# Patient Record
Sex: Female | Born: 1965 | Race: White | Hispanic: No | Marital: Married | Smoking: Former smoker
Health system: Southern US, Community
[De-identification: ages and names within clinical notes are randomized; demographics above are authoritative.]

## PROBLEM LIST (undated history)

## (undated) DIAGNOSIS — K259 Gastric ulcer, unspecified as acute or chronic, without hemorrhage or perforation: Secondary | ICD-10-CM

## (undated) DIAGNOSIS — F419 Anxiety disorder, unspecified: Secondary | ICD-10-CM

## (undated) DIAGNOSIS — M419 Scoliosis, unspecified: Secondary | ICD-10-CM

## (undated) DIAGNOSIS — F32A Depression, unspecified: Secondary | ICD-10-CM

## (undated) DIAGNOSIS — D649 Anemia, unspecified: Secondary | ICD-10-CM

## (undated) HISTORY — PX: TOOTH EXTRACTION: SUR596

## (undated) HISTORY — DX: Anxiety disorder, unspecified: F41.9

## (undated) HISTORY — DX: Anemia, unspecified: D64.9

## (undated) HISTORY — PX: TUBAL LIGATION: SHX77

---

## 2006-05-27 ENCOUNTER — Emergency Department (HOSPITAL_COMMUNITY): Admission: EM | Admit: 2006-05-27 | Discharge: 2006-05-27 | Payer: Self-pay | Admitting: Emergency Medicine

## 2009-06-13 ENCOUNTER — Emergency Department: Payer: Self-pay | Admitting: Unknown Physician Specialty

## 2019-01-11 HISTORY — PX: UPPER GASTROINTESTINAL ENDOSCOPY: SHX188

## 2020-03-31 ENCOUNTER — Other Ambulatory Visit: Payer: Self-pay | Admitting: Gastroenterology

## 2020-03-31 DIAGNOSIS — R7989 Other specified abnormal findings of blood chemistry: Secondary | ICD-10-CM

## 2020-03-31 DIAGNOSIS — K219 Gastro-esophageal reflux disease without esophagitis: Secondary | ICD-10-CM

## 2020-04-06 ENCOUNTER — Other Ambulatory Visit: Payer: Self-pay | Admitting: Gastroenterology

## 2020-04-06 DIAGNOSIS — R1013 Epigastric pain: Secondary | ICD-10-CM

## 2020-04-06 DIAGNOSIS — R634 Abnormal weight loss: Secondary | ICD-10-CM

## 2020-04-07 ENCOUNTER — Ambulatory Visit
Admission: RE | Admit: 2020-04-07 | Discharge: 2020-04-07 | Disposition: A | Discharge status: Home / Self Care | Payer: 59 | Source: Ambulatory Visit | Attending: Gastroenterology | Admitting: Gastroenterology

## 2020-04-07 ENCOUNTER — Other Ambulatory Visit: Payer: Self-pay

## 2020-04-07 DIAGNOSIS — R1013 Epigastric pain: Secondary | ICD-10-CM | POA: Insufficient documentation

## 2020-04-07 DIAGNOSIS — R634 Abnormal weight loss: Secondary | ICD-10-CM | POA: Insufficient documentation

## 2020-04-07 MED ORDER — IOHEXOL 300 MG/ML  SOLN
80.0000 mL | Freq: Once | INTRAMUSCULAR | Status: AC | PRN
  Administered 2020-04-07: 80 mL via INTRAVENOUS

## 2020-04-10 ENCOUNTER — Other Ambulatory Visit: Payer: Self-pay | Admitting: Gastroenterology

## 2020-04-10 DIAGNOSIS — K7460 Unspecified cirrhosis of liver: Secondary | ICD-10-CM

## 2020-04-24 ENCOUNTER — Ambulatory Visit: Payer: 59

## 2020-05-13 ENCOUNTER — Other Ambulatory Visit: Payer: Self-pay

## 2020-05-13 ENCOUNTER — Ambulatory Visit
Admission: RE | Admit: 2020-05-13 | Discharge: 2020-05-13 | Disposition: A | Discharge status: Home / Self Care | Payer: 59 | Source: Ambulatory Visit | Attending: Gastroenterology | Admitting: Gastroenterology

## 2020-05-13 DIAGNOSIS — K7460 Unspecified cirrhosis of liver: Secondary | ICD-10-CM | POA: Insufficient documentation

## 2020-07-02 ENCOUNTER — Ambulatory Visit: Payer: 59 | Admitting: Dietician

## 2020-07-23 ENCOUNTER — Encounter: Payer: Self-pay | Admitting: Dietician

## 2020-07-23 NOTE — Progress Notes (Signed)
Have not heard back from patient to reschedule her missed appointment from 07/02/20. Sent notification to referring provider.

## 2020-12-28 ENCOUNTER — Other Ambulatory Visit: Payer: Self-pay | Admitting: Orthopedic Surgery

## 2020-12-28 DIAGNOSIS — M8008XA Age-related osteoporosis with current pathological fracture, vertebra(e), initial encounter for fracture: Secondary | ICD-10-CM

## 2020-12-30 ENCOUNTER — Other Ambulatory Visit: Payer: Self-pay

## 2020-12-30 ENCOUNTER — Ambulatory Visit
Admission: RE | Admit: 2020-12-30 | Discharge: 2020-12-30 | Disposition: A | Discharge status: Home / Self Care | Payer: 59 | Source: Ambulatory Visit | Attending: Orthopedic Surgery | Admitting: Orthopedic Surgery

## 2020-12-30 DIAGNOSIS — M8008XA Age-related osteoporosis with current pathological fracture, vertebra(e), initial encounter for fracture: Secondary | ICD-10-CM

## 2021-01-26 ENCOUNTER — Other Ambulatory Visit: Payer: Self-pay

## 2021-01-26 ENCOUNTER — Encounter (HOSPITAL_COMMUNITY): Payer: Self-pay | Admitting: Emergency Medicine

## 2021-01-26 ENCOUNTER — Emergency Department (HOSPITAL_COMMUNITY)
Admission: EM | Admit: 2021-01-26 | Discharge: 2021-01-27 | Disposition: A | Discharge status: Home / Self Care | Payer: 59 | Attending: Emergency Medicine | Admitting: Emergency Medicine

## 2021-01-26 ENCOUNTER — Emergency Department (HOSPITAL_COMMUNITY): Payer: 59

## 2021-01-26 DIAGNOSIS — R509 Fever, unspecified: Secondary | ICD-10-CM | POA: Diagnosis not present

## 2021-01-26 DIAGNOSIS — R0781 Pleurodynia: Secondary | ICD-10-CM

## 2021-01-26 DIAGNOSIS — R1011 Right upper quadrant pain: Secondary | ICD-10-CM | POA: Diagnosis present

## 2021-01-26 DIAGNOSIS — R630 Anorexia: Secondary | ICD-10-CM | POA: Diagnosis not present

## 2021-01-26 DIAGNOSIS — N9489 Other specified conditions associated with female genital organs and menstrual cycle: Secondary | ICD-10-CM | POA: Insufficient documentation

## 2021-01-26 HISTORY — DX: Gastric ulcer, unspecified as acute or chronic, without hemorrhage or perforation: K25.9

## 2021-01-26 HISTORY — DX: Scoliosis, unspecified: M41.9

## 2021-01-26 HISTORY — DX: Depression, unspecified: F32.A

## 2021-01-26 LAB — COMPREHENSIVE METABOLIC PANEL
ALT: 48 U/L — ABNORMAL HIGH (ref 0–44)
AST: 64 U/L — ABNORMAL HIGH (ref 15–41)
Albumin: 3.7 g/dL (ref 3.5–5.0)
Alkaline Phosphatase: 92 U/L (ref 38–126)
Anion gap: 9 (ref 5–15)
BUN: 15 mg/dL (ref 6–20)
CO2: 23 mmol/L (ref 22–32)
Calcium: 9.8 mg/dL (ref 8.9–10.3)
Chloride: 106 mmol/L (ref 98–111)
Creatinine, Ser: 0.83 mg/dL (ref 0.44–1.00)
GFR, Estimated: >60 mL/min (ref >60)
Glucose, Bld: 91 mg/dL (ref 70–99)
Potassium: 4.2 mmol/L (ref 3.5–5.1)
Sodium: 138 mmol/L (ref 135–145)
Total Bilirubin: 0.7 mg/dL (ref 0.3–1.2)
Total Protein: 8.3 g/dL — ABNORMAL HIGH (ref 6.5–8.1)

## 2021-01-26 LAB — TROPONIN I (HIGH SENSITIVITY)
Troponin I (High Sensitivity): 4 ng/L (ref <18)
Troponin I (High Sensitivity): 3 ng/L (ref <18)

## 2021-01-26 LAB — URINALYSIS, ROUTINE W REFLEX MICROSCOPIC
Bilirubin Urine: NEGATIVE
Glucose, UA: NEGATIVE mg/dL
Hgb urine dipstick: NEGATIVE
Ketones, ur: NEGATIVE mg/dL
Nitrite: NEGATIVE
Protein, ur: NEGATIVE mg/dL
Specific Gravity, Urine: 1.015 (ref 1.005–1.030)
pH: 8 (ref 5.0–8.0)

## 2021-01-26 LAB — CBC
HCT: 41 % (ref 36.0–46.0)
Hemoglobin: 13.4 g/dL (ref 12.0–15.0)
MCH: 28.6 pg (ref 26.0–34.0)
MCHC: 32.7 g/dL (ref 30.0–36.0)
MCV: 87.4 fL (ref 80.0–100.0)
Platelets: 160 10*3/uL (ref 150–400)
RBC: 4.69 MIL/uL (ref 3.87–5.11)
RDW: 13.7 % (ref 11.5–15.5)
WBC: 4 10*3/uL (ref 4.0–10.5)
nRBC: 0 % (ref 0.0–0.2)

## 2021-01-26 LAB — I-STAT BETA HCG BLOOD, ED (MC, WL, AP ONLY): I-stat hCG, quantitative: <5 m[IU]/mL (ref <5)

## 2021-01-26 LAB — URINALYSIS, MICROSCOPIC (REFLEX)

## 2021-01-26 LAB — LIPASE, BLOOD: Lipase: 39 U/L (ref 11–51)

## 2021-01-26 NOTE — ED Triage Notes (Signed)
C/o epigastric pain that radiates to RUQ and into back since last night.  Denies nausea, vomiting, and diarrhea.  Also reports abd distention.

## 2021-01-26 NOTE — ED Provider Triage Note (Signed)
Emergency Medicine Provider Triage Evaluation Note  Carla Silva , a 56 y.o. female  was evaluated in triage.  Pt complains of chest pain and back pain.  Patient does have a significant history of pathologic vertebral fractures.  She states that she is having some right-sided rib and back pain, this started several days ago.  She did have an MRI recently done at the [facility] and she had had a fracture of T9.  She states that the pain she is having today started after this MRI.  Her pain is made worse with any kind of movement, and deep breathing.  Review of Systems  Positive: Right-sided rib pain, abdominal pain, back pain Negative: Nausea, vomiting  Physical Exam  BP (!) 137/107 (BP Location: Left Arm)    Pulse 95    Temp 98.7 F (37.1 C) (Oral)    Resp 16    SpO2 98%  Gen:   Awake, no distress   Resp:  Normal effort  MSK:   Moves extremities without difficulty  Other:    Medical Decision Making  Medically screening exam initiated at 7:19 PM.  Appropriate orders placed.  DOMITA EDDS was informed that the remainder of the evaluation will be completed by another provider, this initial triage assessment does not replace that evaluation, and the importance of remaining in the ED until their evaluation is complete.     Kateri Plummer, Hershal Coria 01/26/21 1931

## 2021-01-27 ENCOUNTER — Emergency Department (HOSPITAL_COMMUNITY): Payer: 59

## 2021-01-27 MED ORDER — CYCLOBENZAPRINE HCL 10 MG PO TABS: 10.0000 mg | ORAL_TABLET | Freq: Two times a day (BID) | ORAL | 0 refills | Status: AC | PRN

## 2021-01-27 NOTE — Discharge Instructions (Signed)
You were seen in the ER today for your right side pain. Your blood work, xrays, and ultrasound were very reassuring.  Suspect you are having muscle spasms likely related to your scoliosis.  Please continue to use the Flexeril as prescribed, topical pain relief such as lidocaine patches, Biofreeze, and your Voltaren gel, and follow-up with your primary care doctor.  return to the ER with any new severe symptoms.

## 2021-01-27 NOTE — ED Notes (Signed)
Patient verbalizes understanding of discharge instructions. Opportunity for questioning and answers were provided. Armband removed by staff, pt discharged from ED. Ambulated out to lobby  

## 2021-01-27 NOTE — ED Provider Notes (Signed)
[facility] EMERGENCY DEPARTMENT Provider Note   CSN: [id number] Arrival date & time: 01/26/21  1722     History  Chief Complaint  Patient presents with   Abdominal Pain    Carla Silva is a 56 y.o. female who presents with concern for right upper quadrant pain for the last few days, decreased appetite, and subjective fever at home with chills.  Pain radiates around the right back.  I personally read this patient's medical records which is history of scoliosis, T-spine compression fractures, depression, and gastric ulcer in the past.  Does have history of opiate addiction having gone through rehabilitation in 2015.  She is not on any medications daily.  HPI     Home Medications Prior to Admission medications   Not on File      Allergies    Patient has no allergy information on record.    Review of Systems   Review of Systems  Constitutional:  Positive for appetite change, chills and fever. Negative for activity change and fatigue.  HENT: Negative.    Respiratory: Negative.    Cardiovascular: Negative.   Gastrointestinal:  Positive for abdominal pain. Negative for abdominal distention, constipation, diarrhea, nausea and vomiting.  Genitourinary: Negative.   Musculoskeletal: Negative.   Skin: Negative.   Neurological: Negative.   Hematological: Negative.   Psychiatric/Behavioral: Negative.     Physical Exam Updated Vital Signs BP 115/88    Pulse 86    Temp 97.6 F (36.4 C) (Oral)    Resp 19    SpO2 97%  Physical Exam Vitals and nursing note reviewed.  Constitutional:      Appearance: She is not ill-appearing or toxic-appearing.  HENT:     Head: Normocephalic and atraumatic.     Mouth/Throat:     Mouth: Mucous membranes are moist.     Pharynx: No oropharyngeal exudate or posterior oropharyngeal erythema.  Eyes:     General:        Right eye: No discharge.        Left eye: No discharge.     Conjunctiva/sclera: Conjunctivae normal.   Cardiovascular:     Rate and Rhythm: Normal rate and regular rhythm.     Pulses: Normal pulses.     Heart sounds: Normal heart sounds. No murmur heard. Pulmonary:     Effort: Pulmonary effort is normal. No respiratory distress.     Breath sounds: Normal breath sounds. No wheezing or rales.  Abdominal:     General: Bowel sounds are normal. There is no distension.     Palpations: Abdomen is soft.     Tenderness: There is abdominal tenderness in the right upper quadrant. There is no right CVA tenderness, left CVA tenderness, guarding or rebound. Negative signs include Murphy's sign.  Musculoskeletal:        General: No deformity.     Cervical back: Neck supple.  Skin:    General: Skin is warm and dry.     Capillary Refill: Capillary refill takes less than 2 seconds.  Neurological:     General: No focal deficit present.     Mental Status: She is alert and oriented to person, place, and time. Mental status is at baseline.  Psychiatric:        Mood and Affect: Mood normal.    ED Results / Procedures / Treatments   Labs (all labs ordered are listed, but only abnormal results are displayed) Labs Reviewed  COMPREHENSIVE METABOLIC PANEL - Abnormal; Notable for the  following components:      Result Value   Total Protein 8.3 (*)    AST 64 (*)    ALT 48 (*)    All other components within normal limits  URINALYSIS, ROUTINE W REFLEX MICROSCOPIC - Abnormal; Notable for the following components:   Leukocytes,Ua MODERATE (*)    All other components within normal limits  URINALYSIS, MICROSCOPIC (REFLEX) - Abnormal; Notable for the following components:   Bacteria, UA RARE (*)    All other components within normal limits  LIPASE, BLOOD  CBC  I-STAT BETA HCG BLOOD, ED ([facility], [facility], [facility] ONLY)  TROPONIN I (HIGH SENSITIVITY)  TROPONIN I (HIGH SENSITIVITY)    EKG None  Radiology DG Chest 2 View  Result Date: 01/26/2021 CLINICAL DATA:  Chest pain EXAM: CHEST - 2 VIEW COMPARISON:  None.  FINDINGS: The heart size and mediastinal contours are within normal limits. Both lungs are clear. The visualized skeletal structures are unremarkable. IMPRESSION: No active cardiopulmonary disease. Electronically Signed   By: Inez Catalina M.D.   On: 01/26/2021 20:12   DG Thoracic Spine 2 View  Result Date: 01/26/2021 CLINICAL DATA:  Back pain, initial encounter EXAM: THORACIC SPINE 2 VIEWS COMPARISON:  MRI from 12/30/2020 FINDINGS: scoliosis of the thoracic spine is noted concave to the right in the midthoracic spine and concave to the left in the upper lumbar spine. Compression deformity is noted at T3 and T7. This appears chronic in nature. Mild T9 compression deformity is noted similar to that seen on prior MRI. No paraspinal mass is noted. No other focal abnormality is seen. IMPRESSION: Multilevel compression deformities similar to that noted on prior MRI. No acute abnormality noted. Electronically Signed   By: Inez Catalina M.D.   On: 01/26/2021 20:15   US Abdomen Limited RUQ (LIVER/GB)  Result Date: 01/27/2021 CLINICAL DATA:  Right upper quadrant pain EXAM: ULTRASOUND ABDOMEN LIMITED RIGHT UPPER QUADRANT COMPARISON:  None. FINDINGS: Gallbladder: No gallstones or wall thickening visualized. No sonographic Murphy sign noted by sonographer. Common bile duct: Diameter: 5.4 mm. Liver: No focal lesion identified. Within normal limits in parenchymal echogenicity. Portal vein is patent on color Doppler imaging with normal direction of blood flow towards the liver. Other: None. IMPRESSION: No acute abnormality in the right upper quadrant. Electronically Signed   By: Inez Catalina M.D.   On: 01/27/2021 02:51    Procedures Procedures    Medications Ordered in ED Medications - No data to display  ED Course/ Medical Decision Making/ A&P                           Medical Decision Making 56 year old female presents with few days of right upper belly pain and decreased appetite.  The differential  diagnosis for RUQ includes but is not limited to:  Cholelithiasis / choledocholithiasis / cholecystitis / cholangitis, hepatitis (eg. viral, alcoholic, toxic),liver abscess, pancreatitis, liver / pancreatic / biliary tract cancer, ischemic hepatopathy (shock liver), hepatic vein obstruction (Budd-Chiari syndrome), liver cell adenoma, peptic ulcer disease (duodenal), functional or nonulcer dyspepsia, right lower lobe pneumonia, pyelonephritis, urinary calculi,  Fitz-Hugh-Curtis syndrome (with pelvic inflammatory disease), herpes zoster, trauma or musculoskeletal pain, herniated disk, abdominal abscess, intestinal ischemia, physical or sexual abuse, ectopic pregnancy, IUP, Mittelschmerz, ovarian cyst/torsion, threatened/ievitable abortion, PID, endometriosis, molar pregnancy, heterotopic pregnancy, corpus luteum cyst, appendicitis, UTI/renal colic, IBD.    Amount and/or Complexity of Data Reviewed Labs: ordered.    Details: CBC without leukocytosis CMP with transaminitis  with AST/ALTAlready 64/48, improved from patient's baseline.  Lipase is normal, troponin is negative.  UA is without signs of infection. Radiology: ordered.    Details: Plain films of the thoracic spine and chest were obtained from triage and are negative for any acute abnormality.  Right upper quadrant ultrasound obtained by me which was negative for acute abnormality in the right upper quadrant.  Risk Prescription drug management.    Nature of patient's pain is most consistent with a muscle spasm.  Given otherwise reassuring work-up feel this is the most likely etiology for her symptoms.  Suspect muscle spasm of the rib secondary to recent thoracic fractures as well as her severe scoliosis.  Patient already using topical analgesia with lidocaine patches, Biofreeze, and oral Flexeril.  May continue to do this as well as administration of heat application.  May follow-up in the outpatient setting with her primary care doctor.  No  further work-up is warranted in the emergency department at this time given reassuring work-up and normal vital signs.  Jodean voiced understanding of her medical evaluation and treatment plan.  Each of her questions was answered to her expressed satisfaction.  Patient was well-appearing, stable, and was discharged in good condition.   This chart was dictated using voice recognition software, Dragon. Despite the best efforts of this provider to proofread and correct errors, errors may still occur which can change documentation meaning.  Final Clinical Impression(s) / ED Diagnoses Final diagnoses:  RUQ abdominal pain    Rx / DC Orders ED Discharge Orders     None         Aura Dials 01/27/21 [id number]    Fatima Blank, MD 01/28/21 [phone]

## 2021-03-02 ENCOUNTER — Ambulatory Visit: Payer: 59 | Admitting: Adult Health

## 2021-03-09 ENCOUNTER — Ambulatory Visit: Payer: 59 | Admitting: Adult Health

## 2022-05-07 IMAGING — MR MR THORACIC SPINE W/O CM
4 of 6 series · 20 of 48 positions shown · non-contrast
Comparison: Abdominopelvic CT 04/07/2020. Cervical spine
radiographs 05/27/2006.

CLINICAL DATA: Mid back pain with burning and numbness for 1 month.
History of T7 fracture. No previous relevant surgery.

EXAM:
MRI THORACIC SPINE WITHOUT CONTRAST
TECHNIQUE: Multiplanar, multisequence MR imaging of the thoracic spine was
performed. No intravenous contrast was administered.

[Series 3: T1 · sagittal · 3.0mm · 1.06mm/px · 3 of 7 slices shown]
[im 1/7]
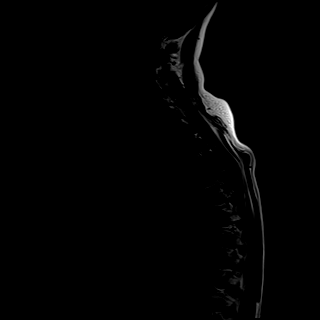
[im 4/7]
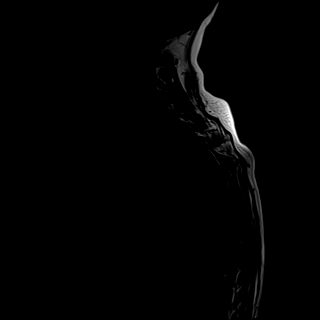
[im 7/7]
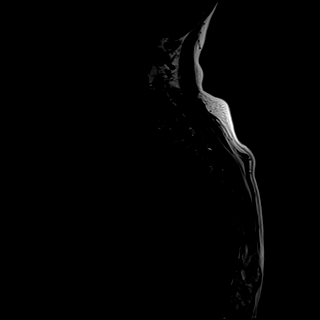

[Series 5: T2 · sagittal · 4.0mm · 0.50mm/px · 6 of 19 slices shown (1 of 3)]
[im 1/19]
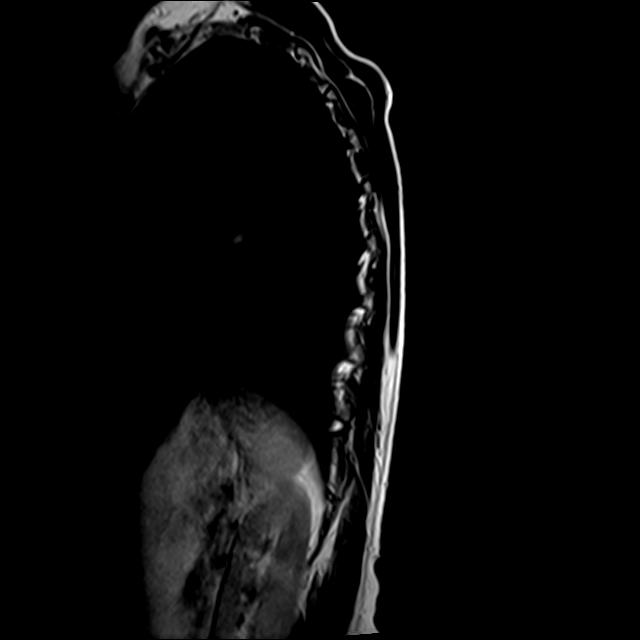
[im 4/19]
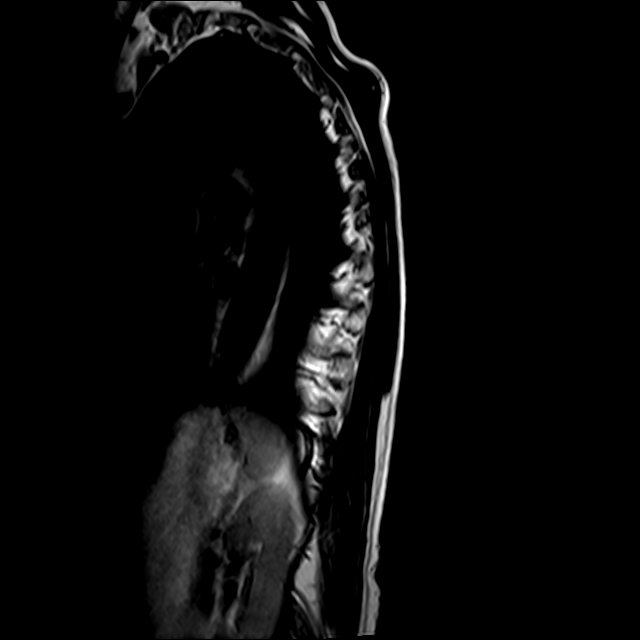
[im 8/19]
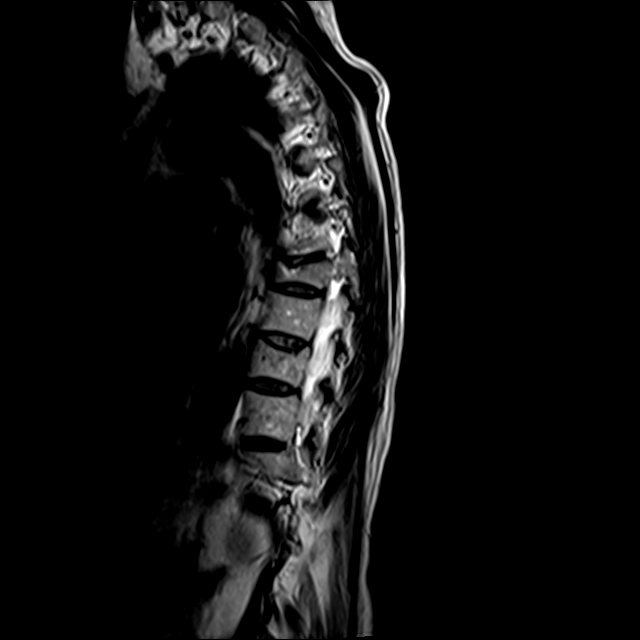
[im 11/19]
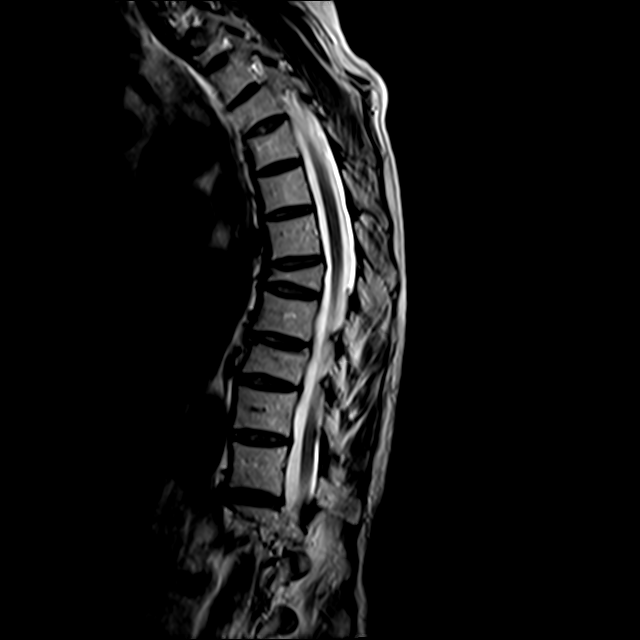
[im 15/19]
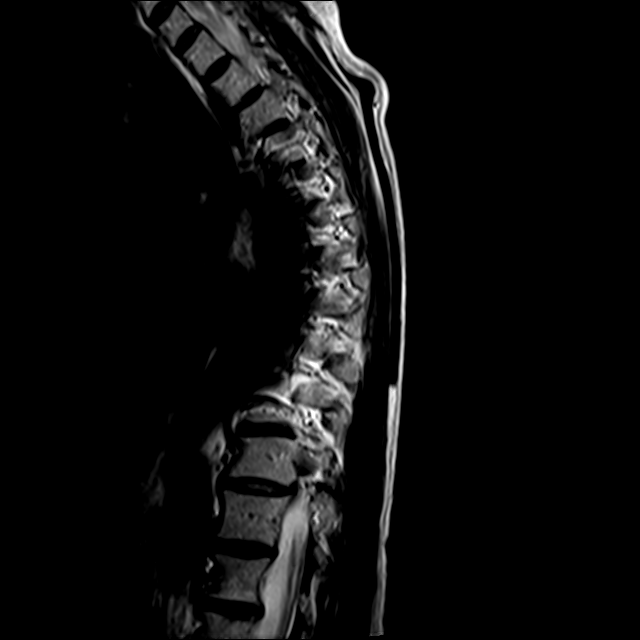
[im 19/19]
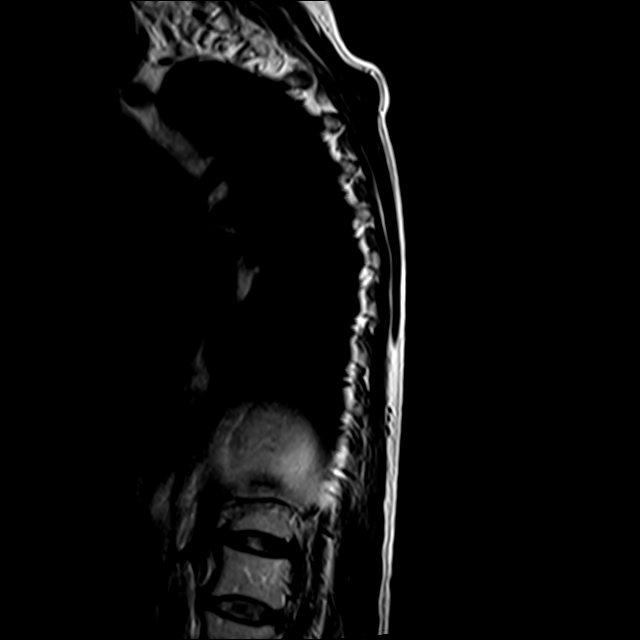

[Series 7: T2 · axial · 4.0mm · 0.39mm/px · z∈[-251,-73]mm · 8 of 40 slices shown (2 of 3)]
[im 1/40]
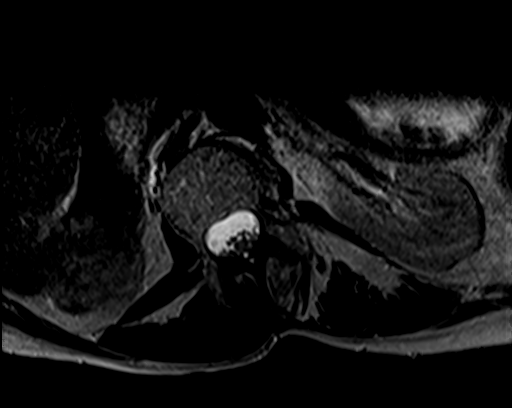
[im 7/40]
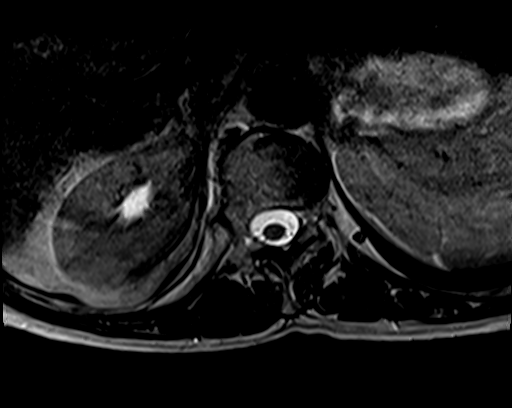
[im 14/40]
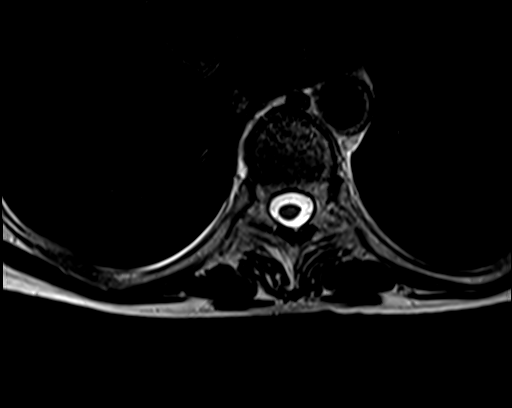
[im 17/40]
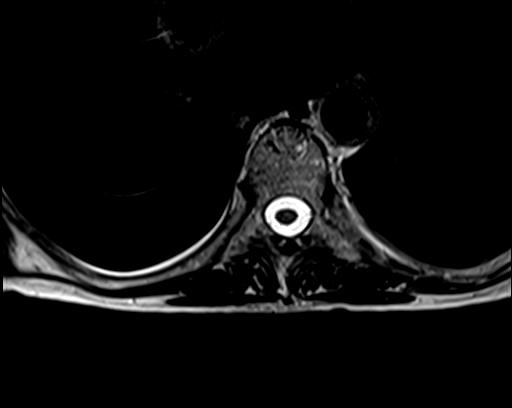
[im 20/40]
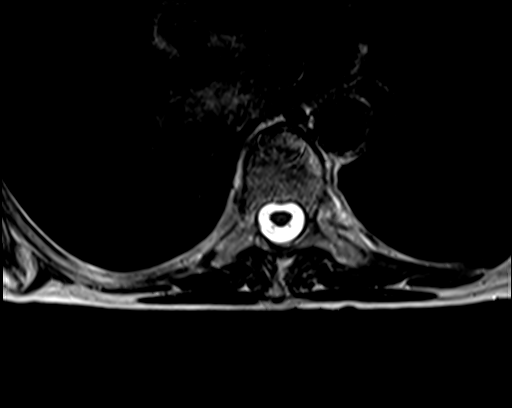
[im 23/40]
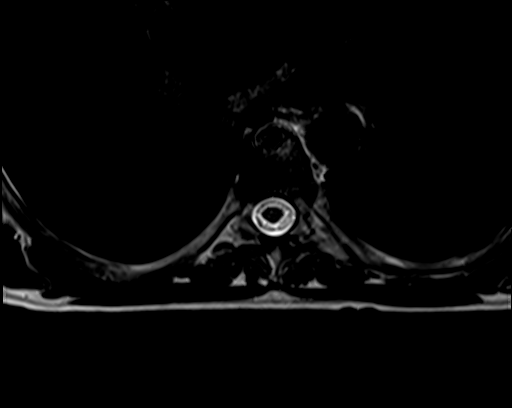
[im 27/40]
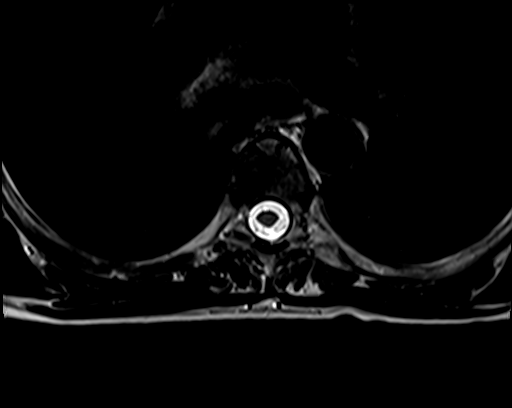
[im 33/40]
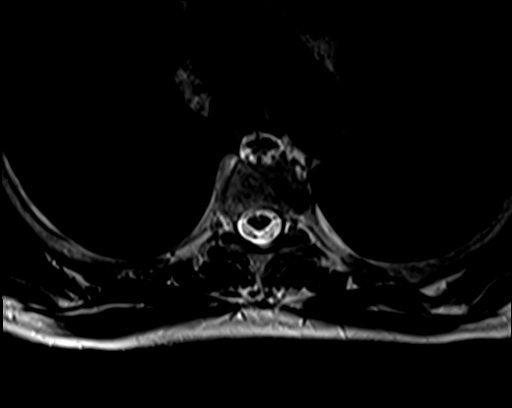

[Series 8: T2 · axial · 4.0mm · 0.39mm/px · z∈[-192,-73]mm · 3 of 40 slices shown (3 of 3)]
[im 7/40]
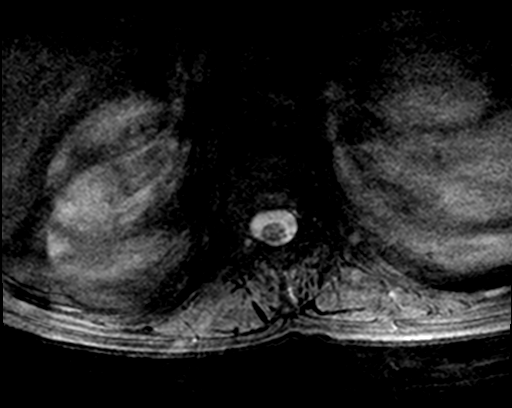
[im 20/40]
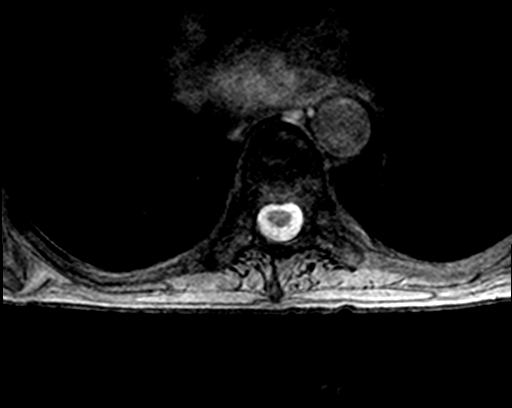
[im 33/40]
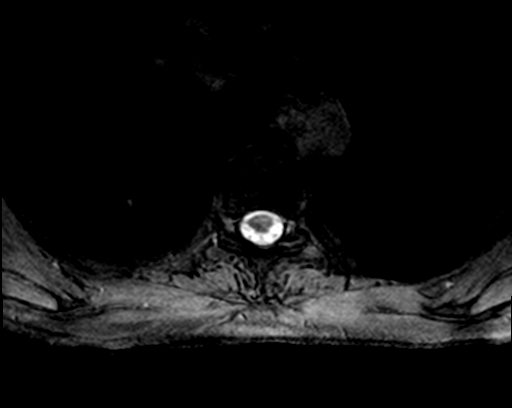

[20 of 48 positions shown; findings below may reference images not displayed]

FINDINGS: Alignment: There is a convex left thoracic scoliosis centered at
approximately T9. Convex right lumbar scoliosis is incompletely
visualized, although grossly stable. The lateral alignment is near
anatomic.

Vertebrae: A biconcave compression deformity at T3 appears healed
without residual marrow edema. There is a healed compression
fracture at T7 resulting in approximately 75% loss of vertebral body
height. There is a more acute appearing superior endplate
compression fracture at T9 with associated marrow edema and
approximately 20% loss of vertebral body height. No significant
osseous retropulsion. The posterior elements appear intact. Sagittal
localizing images of the cervical spine demonstrate decreased
endplate T1 signal at C4-5 without compression deformity, probably
degenerative.

Cord: The thoracic cord is normal in signal and caliber. The conus
medullaris extends to the L1 level.

Paraspinal and other soft tissues: No significant paraspinal
findings.

Disc levels:

As above, probable endplate degenerative changes at C4-5.

No significant thoracic spondylosis. No evidence of disc herniation,
spinal stenosis or nerve root encroachment.
IMPRESSION: 1. Mild acute/subacute superior endplate compression fracture at T9
with mild residual bone marrow edema. No osseous retropulsion or
nerve root encroachment.
2. Old healed T3 and T7 compression fractures.
3. No significant thoracic spondylosis. Probable endplate
degenerative changes at C4-5.

## 2022-06-27 ENCOUNTER — Telehealth: Payer: Self-pay

## 2022-06-27 NOTE — Telephone Encounter (Signed)
Patient scheduled for new patient appt on 07/01/22.  No cardiac hx confirmed with patient.

## 2022-07-01 ENCOUNTER — Ambulatory Visit: Payer: No Typology Code available for payment source | Attending: Internal Medicine | Admitting: Internal Medicine

## 2022-07-01 NOTE — Progress Notes (Deleted)
   New Outpatient Visit Date: 07/01/2022  Referring Provider: [facility] [address] [place],  [place] [address]  Chief Complaint: ***  HPI:  Ms. Sadiq is a 57 y.o. female who is being seen today for the evaluation of tachycardia and heart murmur at the request of [facility] 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 ***  --------------------------------------------------------------------------------------------------  Cardiovascular History & Procedures: Cardiovascular Problems: ***  Risk Factors: ***  Cath/PCI: ***  CV Surgery: ***  EP Procedures and Devices: ***  Non-Invasive Evaluation(s): ***  Recent CV Pertinent Labs: Lab Results  Component Value Date   K 4.2 01/26/2021   BUN 15 01/26/2021   CREATININE 0.83 01/26/2021    --------------------------------------------------------------------------------------------------  Past Medical History:  Diagnosis Date   Anemia    Anxiety    Depression    Gastric ulcer    Scoliosis     Past Surgical History:  Procedure Laterality Date   TOOTH EXTRACTION     TUBAL LIGATION     UPPER GASTROINTESTINAL ENDOSCOPY  2021    No outpatient medications have been marked as taking for the 07/01/22 encounter (Appointment) with Caedmon Louque, Cristal Deer, MD.    Allergies: Patient has no allergy information on record.  Social History   Tobacco Use   Smoking status: Former    Types: Cigarettes   Smokeless tobacco: Never  Substance Use Topics   Alcohol use: Not Currently   Drug use: Not Currently    Comment: previous opiates    Family History  Problem Relation Age of Onset   Hyperlipidemia Mother    Hypertension Mother    Anuerysm Mother    Hyperlipidemia Father    Heart attack Father    Stroke Maternal Grandmother    Heart attack Maternal Grandfather    Heart attack Paternal  Grandmother    Heart attack Paternal Grandfather     Review of Systems: A 12-system review of systems was performed and was negative except as noted in the HPI.  --------------------------------------------------------------------------------------------------  Physical Exam: There were no vitals taken for this visit.  General:  *** HEENT: No conjunctival pallor or scleral icterus. Neck: Supple without lymphadenopathy, thyromegaly, JVD, or HJR. No carotid bruit. Lungs: Normal work of breathing. Clear to auscultation bilaterally without wheezes or crackles. Heart: Regular rate and rhythm without murmurs, rubs, or gallops. Non-displaced PMI. Abd: Bowel sounds present. Soft, NT/ND without hepatosplenomegaly Ext: No lower extremity edema. Radial, PT, and DP pulses are 2+ bilaterally Skin: Warm and dry without rash. Neuro: CNIII-XII intact. Strength and fine-touch sensation intact in upper and lower extremities bilaterally. Psych: Normal mood and affect.  EKG:  ***  Lab Results  Component Value Date   WBC 4.0 01/26/2021   HGB 13.4 01/26/2021   HCT 41.0 01/26/2021   MCV 87.4 01/26/2021   PLT 160 01/26/2021    Lab Results  Component Value Date   NA 138 01/26/2021   K 4.2 01/26/2021   CL 106 01/26/2021   CO2 23 01/26/2021   BUN 15 01/26/2021   CREATININE 0.83 01/26/2021   GLUCOSE 91 01/26/2021   ALT 48 (H) 01/26/2021    No results found for: "CHOL", "HDL", "LDLCALC", "LDLDIRECT", "TRIG", "CHOLHDL"   --------------------------------------------------------------------------------------------------  ASSESSMENT AND PLAN: ***  Yvonne Kendall, MD 07/01/2022 6:39 AM

## 2022-07-04 ENCOUNTER — Encounter: Payer: Self-pay | Admitting: Internal Medicine

## 2022-07-05 ENCOUNTER — Other Ambulatory Visit: Payer: Self-pay | Admitting: Orthopaedic Surgery

## 2022-07-05 DIAGNOSIS — S52001A Unspecified fracture of upper end of right ulna, initial encounter for closed fracture: Secondary | ICD-10-CM

## 2022-07-08 ENCOUNTER — Ambulatory Visit
Admission: RE | Admit: 2022-07-08 | Discharge: 2022-07-08 | Disposition: A | Discharge status: Home / Self Care | Payer: No Typology Code available for payment source | Source: Ambulatory Visit | Attending: Orthopaedic Surgery | Admitting: Orthopaedic Surgery

## 2022-07-08 DIAGNOSIS — S52001A Unspecified fracture of upper end of right ulna, initial encounter for closed fracture: Secondary | ICD-10-CM | POA: Diagnosis present

## 2022-10-28 ENCOUNTER — Other Ambulatory Visit: Payer: Self-pay | Admitting: Orthopaedic Surgery

## 2022-10-28 DIAGNOSIS — S52001A Unspecified fracture of upper end of right ulna, initial encounter for closed fracture: Secondary | ICD-10-CM

## 2022-11-01 ENCOUNTER — Ambulatory Visit
Admission: RE | Admit: 2022-11-01 | Discharge: 2022-11-01 | Disposition: A | Discharge status: Home / Self Care | Payer: 59 | Source: Ambulatory Visit | Attending: Orthopaedic Surgery | Admitting: Orthopaedic Surgery

## 2022-11-01 DIAGNOSIS — S52001A Unspecified fracture of upper end of right ulna, initial encounter for closed fracture: Secondary | ICD-10-CM | POA: Insufficient documentation

## 2022-11-01 DIAGNOSIS — S52101A Unspecified fracture of upper end of right radius, initial encounter for closed fracture: Secondary | ICD-10-CM | POA: Diagnosis present

## 2022-12-21 ENCOUNTER — Emergency Department
Admission: EM | Admit: 2022-12-21 | Discharge: 2022-12-21 | Disposition: A | Discharge status: Home / Self Care | Payer: 59 | Attending: Emergency Medicine | Admitting: Emergency Medicine

## 2022-12-21 ENCOUNTER — Emergency Department: Payer: 59

## 2022-12-21 ENCOUNTER — Other Ambulatory Visit: Payer: Self-pay

## 2022-12-21 DIAGNOSIS — J449 Chronic obstructive pulmonary disease, unspecified: Secondary | ICD-10-CM | POA: Diagnosis not present

## 2022-12-21 DIAGNOSIS — J209 Acute bronchitis, unspecified: Secondary | ICD-10-CM | POA: Diagnosis not present

## 2022-12-21 DIAGNOSIS — R0602 Shortness of breath: Secondary | ICD-10-CM

## 2022-12-21 LAB — BASIC METABOLIC PANEL
Anion gap: 7 (ref 5–15)
BUN: 16 mg/dL (ref 6–20)
CO2: 23 mmol/L (ref 22–32)
Calcium: 9.6 mg/dL (ref 8.9–10.3)
Chloride: 107 mmol/L (ref 98–111)
Creatinine, Ser: 0.88 mg/dL (ref 0.44–1.00)
GFR, Estimated: >60 mL/min (ref >60)
Glucose, Bld: 84 mg/dL (ref 70–99)
Potassium: 3.8 mmol/L (ref 3.5–5.1)
Sodium: 137 mmol/L (ref 135–145)

## 2022-12-21 LAB — TROPONIN I (HIGH SENSITIVITY)
Troponin I (High Sensitivity): 20 ng/L — ABNORMAL HIGH (ref <18)
Troponin I (High Sensitivity): 26 ng/L — ABNORMAL HIGH (ref <18)

## 2022-12-21 LAB — CBC
HCT: 44.1 % (ref 36.0–46.0)
Hemoglobin: 14.9 g/dL (ref 12.0–15.0)
MCH: 29.7 pg (ref 26.0–34.0)
MCHC: 33.8 g/dL (ref 30.0–36.0)
MCV: 88 fL (ref 80.0–100.0)
Platelets: 157 10*3/uL (ref 150–400)
RBC: 5.01 MIL/uL (ref 3.87–5.11)
RDW: 13.2 % (ref 11.5–15.5)
WBC: 4.4 10*3/uL (ref 4.0–10.5)
nRBC: 0 % (ref 0.0–0.2)

## 2022-12-21 MED ORDER — PREDNISONE 10 MG PO TABS: 10.0000 mg | ORAL_TABLET | Freq: Every day | ORAL | 0 refills | Status: DC

## 2022-12-21 MED ORDER — AZITHROMYCIN 250 MG PO TABS: ORAL_TABLET | ORAL | 0 refills | Status: AC

## 2022-12-21 MED ORDER — PREDNISONE 20 MG PO TABS
60.0000 mg | ORAL_TABLET | Freq: Once | ORAL | Status: AC
  Administered 2022-12-21: 60 mg via ORAL
  Filled 2022-12-21: qty 3

## 2022-12-21 MED ORDER — AZITHROMYCIN 500 MG PO TABS
500.0000 mg | ORAL_TABLET | Freq: Once | ORAL | Status: AC
  Administered 2022-12-21: 500 mg via ORAL
  Filled 2022-12-21: qty 1

## 2022-12-21 NOTE — ED Provider Notes (Signed)
[facility] Provider Note    Event Date/Time   First MD Initiated Contact with Patient 12/21/22 2041     (approximate)  History   Chief Complaint: Shortness of Breath  HPI  Carla Silva is a 57 y.o. female with a past medical history of anemia, presents to the emergency department for shortness of breath and cough.  According to the patient for the past week or so she has been experiencing congestion and cough.  Patient states runny nose with sinus pressure and coughing.  Denies any fever at any point.  Patient states she has been using a home pulse oximeter and was getting readings as low as 89 so she came to the emergency department for evaluation.  Patient does state a long smoking history but quit approximately 5 years ago has never been formally diagnosed with COPD.  Denies any chest pain at any point.  Physical Exam   Triage Vital Signs: ED Triage Vitals  Encounter Vitals Group     BP 12/21/22 1726 (!) 122/90     Systolic BP Percentile --      Diastolic BP Percentile --      Pulse Rate 12/21/22 1726 90     Resp 12/21/22 1726 18     Temp 12/21/22 1727 98.3 F (36.8 C)     Temp Source 12/21/22 1727 Oral     SpO2 12/21/22 1726 96 %     Weight 12/21/22 1725 113 lb (51.3 kg)     Height 12/21/22 1725 5\' 3"  (1.6 m)     Head Circumference --      Peak Flow --      Pain Score 12/21/22 1724 5     Pain Loc --      Pain Education --      Exclude from Growth Chart --     Most recent vital signs: Vitals:   12/21/22 1726 12/21/22 1727  BP: (!) 122/90   Pulse: 90   Resp: 18   Temp:  98.3 F (36.8 C)  SpO2: 96%     General: Awake, no distress.  CV:  Good peripheral perfusion.  Regular rate and rhythm  Resp:  Normal effort.  Breath sounds bilaterally very slight end expiratory wheeze. Abd:  No distention.  Soft, nontender.  No rebound or guarding.  ED Results / Procedures / Treatments   EKG  EKG viewed and interpreted by myself shows a  normal sinus rhythm 80 bpm with a narrow QRS, normal axis, normal intervals, no concerning ST changes  RADIOLOGY  I have reviewed and interpreted chest x-ray images.  No consolidation on my evaluation. Radiology is read the x-ray is negative for acute process.   MEDICATIONS ORDERED IN ED: Medications - No data to display   IMPRESSION / MDM / ASSESSMENT AND PLAN / ED COURSE  I reviewed the triage vital signs and the nursing notes.  Patient's presentation is most consistent with acute presentation with potential threat to life or bodily function.  Patient presents to the emergency department for shortness of breath cough congestion over the past 1 week.  Overall the patient appears well, no distress.  Good breath sounds bilaterally very slight end expiratory wheeze.  Patient is maintaining sats in mid 90s in the emergency department on room air.  CBC is reassuring with a normal white blood cell count, chemistry is reassuring.  Troponin slightly elevated although not significantly changed after several hours.  Patient denies any chest pain at any  point.  Patient's chest x-ray is clear.  Given the patient's significant smoking history as well as slight end expiratory wheeze highly suspect more of an inflammatory response possible COPD but is not yet been diagnosed.  We will start the patient on a prednisone taper as well as Zithromax for 5 days.  I also discussed cardiology referral and follow-up for the patient as well as a PCP referral as the patient does not currently have a PCP.  Patient agreeable to plan of care.  FINAL CLINICAL IMPRESSION(S) / ED DIAGNOSES   Bronchitis Upper respiratory infection   Note:  This document was prepared using Dragon voice recognition software and may include unintentional dictation errors.   Minna Antis, MD 12/21/22 2159

## 2022-12-21 NOTE — ED Triage Notes (Signed)
Patient states weakness, shortness of breath and palpitations for the past 2-3 days.

## 2022-12-21 NOTE — Discharge Instructions (Addendum)
Please begin taking your medications tomorrow 12/22/2022.  You should be receiving a phone call for a primary care as well as a cardiology appointment for further evaluation.  Return to the emergency department for any worsening shortness of breath development of any chest pain, fever, or any other symptom personally concerning to yourself.

## 2023-01-06 ENCOUNTER — Emergency Department: Payer: 59

## 2023-01-06 ENCOUNTER — Inpatient Hospital Stay
Admission: EM | Admit: 2023-01-06 | Discharge: 2023-01-09 | DRG: 291 | Disposition: A | Discharge status: Home / Self Care | Payer: 59 | Attending: Internal Medicine | Admitting: Internal Medicine

## 2023-01-06 DIAGNOSIS — I110 Hypertensive heart disease with heart failure: Secondary | ICD-10-CM | POA: Diagnosis present

## 2023-01-06 DIAGNOSIS — X58XXXA Exposure to other specified factors, initial encounter: Secondary | ICD-10-CM | POA: Diagnosis present

## 2023-01-06 DIAGNOSIS — K7460 Unspecified cirrhosis of liver: Secondary | ICD-10-CM | POA: Diagnosis not present

## 2023-01-06 DIAGNOSIS — Z823 Family history of stroke: Secondary | ICD-10-CM | POA: Diagnosis not present

## 2023-01-06 DIAGNOSIS — F419 Anxiety disorder, unspecified: Secondary | ICD-10-CM | POA: Diagnosis present

## 2023-01-06 DIAGNOSIS — R7989 Other specified abnormal findings of blood chemistry: Secondary | ICD-10-CM | POA: Diagnosis not present

## 2023-01-06 DIAGNOSIS — I5031 Acute diastolic (congestive) heart failure: Secondary | ICD-10-CM | POA: Diagnosis not present

## 2023-01-06 DIAGNOSIS — Z87891 Personal history of nicotine dependence: Secondary | ICD-10-CM

## 2023-01-06 DIAGNOSIS — Z888 Allergy status to other drugs, medicaments and biological substances status: Secondary | ICD-10-CM | POA: Diagnosis not present

## 2023-01-06 DIAGNOSIS — F1290 Cannabis use, unspecified, uncomplicated: Secondary | ICD-10-CM | POA: Diagnosis present

## 2023-01-06 DIAGNOSIS — G894 Chronic pain syndrome: Secondary | ICD-10-CM | POA: Diagnosis present

## 2023-01-06 DIAGNOSIS — M4854XA Collapsed vertebra, not elsewhere classified, thoracic region, initial encounter for fracture: Secondary | ICD-10-CM | POA: Diagnosis present

## 2023-01-06 DIAGNOSIS — I272 Pulmonary hypertension, unspecified: Secondary | ICD-10-CM | POA: Diagnosis present

## 2023-01-06 DIAGNOSIS — R0789 Other chest pain: Secondary | ICD-10-CM | POA: Diagnosis not present

## 2023-01-06 DIAGNOSIS — I5033 Acute on chronic diastolic (congestive) heart failure: Secondary | ICD-10-CM | POA: Diagnosis present

## 2023-01-06 DIAGNOSIS — I10 Essential (primary) hypertension: Secondary | ICD-10-CM | POA: Insufficient documentation

## 2023-01-06 DIAGNOSIS — R079 Chest pain, unspecified: Secondary | ICD-10-CM | POA: Diagnosis not present

## 2023-01-06 DIAGNOSIS — Z83438 Family history of other disorder of lipoprotein metabolism and other lipidemia: Secondary | ICD-10-CM

## 2023-01-06 DIAGNOSIS — S22000A Wedge compression fracture of unspecified thoracic vertebra, initial encounter for closed fracture: Secondary | ICD-10-CM | POA: Insufficient documentation

## 2023-01-06 DIAGNOSIS — F32A Depression, unspecified: Secondary | ICD-10-CM | POA: Diagnosis present

## 2023-01-06 DIAGNOSIS — Z79899 Other long term (current) drug therapy: Secondary | ICD-10-CM | POA: Diagnosis not present

## 2023-01-06 DIAGNOSIS — M419 Scoliosis, unspecified: Secondary | ICD-10-CM | POA: Diagnosis present

## 2023-01-06 DIAGNOSIS — Z5986 Financial insecurity: Secondary | ICD-10-CM

## 2023-01-06 DIAGNOSIS — J40 Bronchitis, not specified as acute or chronic: Secondary | ICD-10-CM | POA: Diagnosis present

## 2023-01-06 DIAGNOSIS — R072 Precordial pain: Secondary | ICD-10-CM | POA: Diagnosis present

## 2023-01-06 DIAGNOSIS — Z8249 Family history of ischemic heart disease and other diseases of the circulatory system: Secondary | ICD-10-CM

## 2023-01-06 DIAGNOSIS — I509 Heart failure, unspecified: Secondary | ICD-10-CM | POA: Diagnosis not present

## 2023-01-06 DIAGNOSIS — Z8711 Personal history of peptic ulcer disease: Secondary | ICD-10-CM

## 2023-01-06 DIAGNOSIS — S22050A Wedge compression fracture of T5-T6 vertebra, initial encounter for closed fracture: Secondary | ICD-10-CM | POA: Diagnosis not present

## 2023-01-06 DIAGNOSIS — R0609 Other forms of dyspnea: Principal | ICD-10-CM

## 2023-01-06 LAB — CBC WITH DIFFERENTIAL/PLATELET
Abs Immature Granulocytes: 0.02 10*3/uL (ref 0.00–0.07)
Basophils Absolute: 0.1 10*3/uL (ref 0.0–0.1)
Basophils Relative: 1 %
Eosinophils Absolute: 0.1 10*3/uL (ref 0.0–0.5)
Eosinophils Relative: 1 %
HCT: 46.4 % — ABNORMAL HIGH (ref 36.0–46.0)
Hemoglobin: 15.5 g/dL — ABNORMAL HIGH (ref 12.0–15.0)
Immature Granulocytes: 0 %
Lymphocytes Relative: 32 %
Lymphs Abs: 1.8 10*3/uL (ref 0.7–4.0)
MCH: 30.1 pg (ref 26.0–34.0)
MCHC: 33.4 g/dL (ref 30.0–36.0)
MCV: 90.1 fL (ref 80.0–100.0)
Monocytes Absolute: 0.7 10*3/uL (ref 0.1–1.0)
Monocytes Relative: 12 %
Neutro Abs: 2.9 10*3/uL (ref 1.7–7.7)
Neutrophils Relative %: 54 %
Platelets: 147 10*3/uL — ABNORMAL LOW (ref 150–400)
RBC: 5.15 MIL/uL — ABNORMAL HIGH (ref 3.87–5.11)
RDW: 13.2 % (ref 11.5–15.5)
WBC: 5.5 10*3/uL (ref 4.0–10.5)
nRBC: 0 % (ref 0.0–0.2)

## 2023-01-06 LAB — COMPREHENSIVE METABOLIC PANEL
ALT: 39 U/L (ref 0–44)
AST: 58 U/L — ABNORMAL HIGH (ref 15–41)
Albumin: 3.5 g/dL (ref 3.5–5.0)
Alkaline Phosphatase: 74 U/L (ref 38–126)
Anion gap: 7 (ref 5–15)
BUN: 15 mg/dL (ref 6–20)
CO2: 22 mmol/L (ref 22–32)
Calcium: 9.2 mg/dL (ref 8.9–10.3)
Chloride: 105 mmol/L (ref 98–111)
Creatinine, Ser: 1.01 mg/dL — ABNORMAL HIGH (ref 0.44–1.00)
GFR, Estimated: >60 mL/min (ref >60)
Glucose, Bld: 90 mg/dL (ref 70–99)
Potassium: 3.7 mmol/L (ref 3.5–5.1)
Sodium: 134 mmol/L — ABNORMAL LOW (ref 135–145)
Total Bilirubin: 1.2 mg/dL — ABNORMAL HIGH (ref <1.2)
Total Protein: 8 g/dL (ref 6.5–8.1)

## 2023-01-06 LAB — TROPONIN I (HIGH SENSITIVITY)
Troponin I (High Sensitivity): 62 ng/L — ABNORMAL HIGH (ref <18)
Troponin I (High Sensitivity): 52 ng/L — ABNORMAL HIGH (ref <18)
Troponin I (High Sensitivity): 60 ng/L — ABNORMAL HIGH (ref <18)
Troponin I (High Sensitivity): 58 ng/L — ABNORMAL HIGH (ref <18)

## 2023-01-06 LAB — TSH: TSH: 5.07 u[IU]/mL — ABNORMAL HIGH (ref 0.350–4.500)

## 2023-01-06 LAB — BRAIN NATRIURETIC PEPTIDE: B Natriuretic Peptide: 1698.2 pg/mL — ABNORMAL HIGH (ref 0.0–100.0)

## 2023-01-06 MED ORDER — IOHEXOL 350 MG/ML SOLN
75.0000 mL | Freq: Once | INTRAVENOUS | Status: AC | PRN
  Administered 2023-01-06: 75 mL via INTRAVENOUS

## 2023-01-06 MED ORDER — ACETAMINOPHEN 325 MG PO TABS
650.0000 mg | ORAL_TABLET | ORAL | Status: DC | PRN
  Administered 2023-01-07 – 2023-01-09 (×5): 650 mg via ORAL
  Filled 2023-01-06 (×5): qty 2

## 2023-01-06 MED ORDER — ONDANSETRON HCL 4 MG/2ML IJ SOLN: 4.0000 mg | Freq: Four times a day (QID) | INTRAMUSCULAR | Status: DC | PRN

## 2023-01-06 MED ORDER — ENOXAPARIN SODIUM 40 MG/0.4ML IJ SOSY
40.0000 mg | PREFILLED_SYRINGE | INTRAMUSCULAR | Status: DC
  Filled 2023-01-06 (×2): qty 0.4

## 2023-01-06 MED ORDER — ALBUTEROL SULFATE HFA 108 (90 BASE) MCG/ACT IN AERS
2.0000 | INHALATION_SPRAY | RESPIRATORY_TRACT | Status: DC | PRN
  Filled 2023-01-06: qty 6.7

## 2023-01-06 MED ORDER — HYDROCODONE-ACETAMINOPHEN 5-325 MG PO TABS
1.0000 | ORAL_TABLET | Freq: Four times a day (QID) | ORAL | Status: DC | PRN
  Filled 2023-01-06: qty 1

## 2023-01-06 NOTE — H&P (Addendum)
History and Physical    Patient: Carla Silva NWG:[id number] DOB: 1965-07-04 DOA: 01/06/2023 DOS: the patient was seen and examined on 01/06/2023 PCP: Nira Retort  Patient coming from: Home  Chief Complaint:  Chief Complaint  Patient presents with   Shortness of Breath   HPI: Carla Silva is a 57 y.o. female with medical history significant of anxiety depression, marijuana use presenting with exertional chest pain.  Patient reports progressive exertional chest pain chest pressure over the past 2 weeks.  Patient denies any prior episodes like this in the past.  Was initially evaluated and placed on course of prednisone as well as antibiotics with concern for bronchitis.  Symptoms persisted despite treatment.  No fevers or chills.  Minimal cough.  Patient can only take a handful of steps without significant shortness of breath and chest pressure.  Positive mild diaphoresis and nausea.  Previously heavy smoker.  Quit several years ago.  Now vapes as well as uses marijuana.  No reported alcohol use.  No reported other illicit drug use.  Mild orthopnea PND.  Was evaluated at cardiology clinic today and redirected to the ER. Presented to the ER afebrile, hemodynamically stable.  Satting well on room air.  White count 5.5, hemoglobin 15.5, platelets 147, troponin 62-52.  Creatinine 1.01.  EKG normal sinus rhythm with nonspecific ST and T wave changes.CTA negative for PE.  Showing mild bibasilar subsegmental atelectasis.  Mild pleural fluid.  Concerning for right heart dysfunction.  Noted compression fractures throughout the thoracic spine. Liver cirrhotic changes.  Review of Systems: As mentioned in the history of present illness. All other systems reviewed and are negative. Past Medical History:  Diagnosis Date   Anemia    Anxiety    Depression    Gastric ulcer    Scoliosis    Past Surgical History:  Procedure Laterality Date   TOOTH EXTRACTION     TUBAL LIGATION     UPPER  GASTROINTESTINAL ENDOSCOPY  2021   Social History:  reports that she has quit smoking. Her smoking use included cigarettes. She has never used smokeless tobacco. She reports that she does not currently use alcohol. She reports that she does not currently use drugs.  Allergies  Allergen Reactions   Macrobid [Nitrofurantoin]     Family History  Problem Relation Age of Onset   Hyperlipidemia Mother    Hypertension Mother    Anuerysm Mother    Hyperlipidemia Father    Heart attack Father    Stroke Maternal Grandmother    Heart attack Maternal Grandfather    Heart attack Paternal Grandmother    Heart attack Paternal Grandfather     Prior to Admission medications   Medication Sig Start Date End Date Taking? Authorizing Provider  cyclobenzaprine (FLEXERIL) 10 MG tablet Take 1 tablet (10 mg total) by mouth 2 (two) times daily as needed for muscle spasms. 01/27/21   Sponseller, Eugene Gavia, PA-C  predniSONE (DELTASONE) 10 MG tablet Take 1 tablet (10 mg total) by mouth daily. Day 1-3: take 4 tablets PO daily Day 4-6: take 3 tablets PO daily Day 7-9: take 2 tablets PO daily Day 10-12: take 1 tablet PO daily 12/21/22   Minna Antis, MD    Physical Exam: Vitals:   01/06/23 1230 01/06/23 1400 01/06/23 1500 01/06/23 1554  BP: (!) 120/96 118/88 (!) 112/92   Pulse: 92 90 83   Resp: (!) 24 20 (!) 22   Temp:  97.8 F (36.6 C)    TempSrc:  Oral    SpO2: 100% 99% 98%   Weight:    54.1 kg  Height:    5\' 3"  (1.6 m)   Physical Exam Constitutional:      Appearance: She is normal weight.  HENT:     Head: Normocephalic and atraumatic.     Nose: Nose normal.     Mouth/Throat:     Mouth: Mucous membranes are moist.  Cardiovascular:     Rate and Rhythm: Normal rate and regular rhythm.  Pulmonary:     Effort: Pulmonary effort is normal.  Abdominal:     General: Bowel sounds are normal.  Musculoskeletal:        General: Normal range of motion.  Skin:    General: Skin is warm.   Neurological:     General: No focal deficit present.  Psychiatric:        Mood and Affect: Mood normal.     Data Reviewed:  There are no new results to review at this time.  CT Angio Chest PE W and/or Wo Contrast CLINICAL DATA:  Pulmonary embolism (PE) suspected, high prob  EXAM: CT ANGIOGRAPHY CHEST WITH CONTRAST  TECHNIQUE: Multidetector CT imaging of the chest was performed using the standard protocol during bolus administration of intravenous contrast. Multiplanar CT image reconstructions and MIPs were obtained to evaluate the vascular anatomy.  RADIATION DOSE REDUCTION: This exam was performed according to the departmental dose-optimization program which includes automated exposure control, adjustment of the mA and/or kV according to patient size and/or use of iterative reconstruction technique.  CONTRAST:  75mL OMNIPAQUE IOHEXOL 350 MG/ML SOLN  COMPARISON:  X-ray 01/06/2023  FINDINGS: Cardiovascular: Heart size within normal limits. No significant pericardial effusion although there is some fluid distension of the pericardial recesses. Thoracic aorta is nonaneurysmal. Scattered atherosclerotic vascular calcifications of the aorta and coronary arteries. Pulmonary vasculature is well opacified. There is some streaky artifact in the left main pulmonary artery which also involves the lobar branches of the left upper and left lower lobes. This is felt to be secondary to turbulent flow. No evidence of pulmonary embolism. Pulmonary arteries are mildly dilated. There is distension of the right ventricle with reflux of contrast into the IVC and hepatic veins compatible with right heart dysfunction.  Mediastinum/Nodes: No enlarged mediastinal, hilar, or axillary lymph nodes. Thyroid gland, trachea, and esophagus demonstrate no significant findings.  Lungs/Pleura: Mild emphysema. Mild bibasilar subsegmental atelectasis, right greater than left. Trace amount of pleural  fluid present bilaterally. No pneumothorax.  Upper Abdomen: Nodular hepatic surface contour suggestive of cirrhosis.  Musculoskeletal: Thoracic levocurvature. Numerous compression fractures throughout the thoracic spine, most severely affecting the T5 and T7 vertebral bodies. Fractures appear to be chronic although the T5 level could potentially be subacute. Healing left ninth rib fracture. Multiple healed right-sided rib fractures. No chest wall abnormality.  Review of the MIP images confirms the above findings.  IMPRESSION: 1. No evidence of pulmonary embolism. 2. Mild bibasilar subsegmental atelectasis, right greater than left. Trace amount of pleural fluid present bilaterally. 3. Findings compatible with right heart dysfunction. 4. Numerous compression fractures throughout the thoracic spine, most severely affecting the T5 and T7 vertebral bodies. Fractures appear to be chronic although the T5 level could potentially be subacute. 5. Nodular hepatic surface contour suggestive of cirrhosis. 6. Aortic and coronary artery atherosclerosis (ICD10-I70.0).  Electronically Signed   By: Duanne Guess D.O.   On: 01/06/2023 14:09 DG Chest 2 View CLINICAL DATA:  Two-week history of shortness of breath and  weakness  EXAM: CHEST - 2 VIEW  COMPARISON:  Chest radiograph dated 12/21/2018  FINDINGS: Normal lung volumes. Right basilar patchy and linear opacities. Blunting of the bilateral costophrenic angles. No pneumothorax. The heart size and mediastinal contours are within normal limits. No acute osseous abnormality.  IMPRESSION: 1. Right basilar patchy and linear opacities, which may represent atelectasis, aspiration, or pneumonia. 2. Blunting of the bilateral costophrenic angles, which may represent trace pleural effusions or pleural thickening.  Electronically Signed   By: Agustin Cree M.D.   On: 01/06/2023 10:52  Lab Results  Component Value Date   WBC 5.5 01/06/2023    HGB 15.5 (H) 01/06/2023   HCT 46.4 (H) 01/06/2023   MCV 90.1 01/06/2023   PLT 147 (L) 01/06/2023   Last metabolic panel Lab Results  Component Value Date   GLUCOSE 90 01/06/2023   NA 134 (L) 01/06/2023   K 3.7 01/06/2023   CL 105 01/06/2023   CO2 22 01/06/2023   BUN 15 01/06/2023   CREATININE 1.01 (H) 01/06/2023   GFRNONAA >60 01/06/2023   CALCIUM 9.2 01/06/2023   PROT 8.0 01/06/2023   ALBUMIN 3.5 01/06/2023   BILITOT 1.2 (H) 01/06/2023   ALKPHOS 74 01/06/2023   AST 58 (H) 01/06/2023   ALT 39 01/06/2023   ANIONGAP 7 01/06/2023    Assessment and Plan: * Chest pain Recurrent exertional chest pain/pressure and SOB with progressive worsening of the past 2 weeks CTA negative for PE but is showing concern for right heart dysfunction Troponin 50s to 60s EKG normal sinus rhythm with nonspecific changes Heart score around 5 Will plan for formal chest pain evaluation including 2D echo, risk stratification labs, BNP Start aspirin Cardiology consulted Follow-up recommendations   Cirrhosis (HCC) Noted cirrhotic liver changes on CT  No reported ETOH use  LFTs grossly stable  Monitor   Thoracic compression fracture (HCC) Noted  Numerous compression fractures throughout the thoracic spine most severely affecting the T5 and T7 on imaging  Suspect partial contribution to overall pain on presentation  Discussed w/ Dr. Katrinka Blazing w/ NSG  Plan to order TLSO brace  PT/OT eval  Pain control  Follow    Marijuana use Regular marijuana use      Greater than 50% was spent in counseling and coordination of care with patient Total encounter time 80 minutes or more   Advance Care Planning:   Code Status: Full Code   Consults: Cardiology, NSG   Family Communication: Family at the bedside   Severity of Illness: The appropriate patient status for this patient is INPATIENT. Inpatient status is judged to be reasonable and necessary in order to provide the required intensity of service  to ensure the patient's safety. The patient's presenting symptoms, physical exam findings, and initial radiographic and laboratory data in the context of their chronic comorbidities is felt to place them at high risk for further clinical deterioration. Furthermore, it is not anticipated that the patient will be medically stable for discharge from the hospital within 2 midnights of admission.   * I certify that at the point of admission it is my clinical judgment that the patient will require inpatient hospital care spanning beyond 2 midnights from the point of admission due to high intensity of service, high risk for further deterioration and high frequency of surveillance required.*  Author: Floydene Flock, MD 01/06/2023 4:05 PM  For on call review [email or web address].

## 2023-01-06 NOTE — Progress Notes (Signed)
I was able to review her imaging of her CTPE which demonstrated her thoracic spine and showed some likely chronic but potentially want acute on chronic compression fractures.  These should be treated non-surgically and can be treated with a TLSO brace worn for comfort only. Orders have been placed. We'll have her see [place] for follow up in Neurosurgery clinic with upright x-rays.

## 2023-01-06 NOTE — Assessment & Plan Note (Addendum)
Noted  Numerous compression fractures throughout the thoracic spine most severely affecting the T5 and T7 on imaging  Suspect partial contribution to overall pain on presentation  Discussed w/ Dr. Katrinka Blazing w/ NSG  Plan to order TLSO brace  PT/OT eval  Pain control  Follow

## 2023-01-06 NOTE — Assessment & Plan Note (Signed)
Noted cirrhotic liver changes on CT  No reported ETOH use  LFTs grossly stable  Monitor

## 2023-01-06 NOTE — Assessment & Plan Note (Addendum)
Recurrent exertional chest pain/pressure and SOB with progressive worsening of the past 2 weeks CTA negative for PE but is showing concern for right heart dysfunction Troponin 50s to 60s EKG normal sinus rhythm with nonspecific changes Heart score around 5 Will plan for formal chest pain evaluation including 2D echo, risk stratification labs, BNP Start aspirin Cardiology consulted Follow-up recommendations

## 2023-01-06 NOTE — Assessment & Plan Note (Signed)
Regular marijuana use

## 2023-01-06 NOTE — ED Provider Notes (Addendum)
[facility] Provider Note    Event Date/Time   First MD Initiated Contact with Patient 01/06/23 1117     (approximate)   History   Chief Complaint: Shortness of Breath   HPI  Carla Silva is a 57 y.o. female with a history of stomach ulcer, depression who comes ED complaining of shortness of breath and fatigue for the past 2 weeks, worsening.  Worse with walking even within the house, decreased exercise tolerance down to just a few feet currently.  Endorses mild chest discomfort as well.  No fever.  Was treated for bronchitis a week ago with steroids and azithromycin, without relief.          Physical Exam   Triage Vital Signs: ED Triage Vitals  Encounter Vitals Group     BP 01/06/23 0944 (!) 117/98     Systolic BP Percentile --      Diastolic BP Percentile --      Pulse Rate 01/06/23 0944 78     Resp 01/06/23 0944 18     Temp 01/06/23 0944 97.8 F (36.6 C)     Temp Source 01/06/23 0944 Oral     SpO2 01/06/23 0944 94 %     Weight --      Height --      Head Circumference --      Peak Flow --      Pain Score 01/06/23 0945 0     Pain Loc --      Pain Education --      Exclude from Growth Chart --     Most recent vital signs: Vitals:   01/06/23 1230 01/06/23 1400  BP: (!) 120/96 118/88  Pulse: 92 90  Resp: (!) 24 20  Temp:  97.8 F (36.6 C)  SpO2: 100% 99%    General: Awake, no distress.  CV:  Good peripheral perfusion.  Regular rate rhythm, normal distal pulses Resp:  Normal effort.  Clear to auscultation bilaterally Abd:  No distention.  Soft nontender Other:  No calf swelling or lower extremity tenderness   ED Results / Procedures / Treatments   Labs (all labs ordered are listed, but only abnormal results are displayed) Labs Reviewed  CBC WITH DIFFERENTIAL/PLATELET - Abnormal; Notable for the following components:      Result Value   RBC 5.15 (*)    Hemoglobin 15.5 (*)    HCT 46.4 (*)    Platelets 147 (*)     All other components within normal limits  COMPREHENSIVE METABOLIC PANEL - Abnormal; Notable for the following components:   Sodium 134 (*)    Creatinine, Ser 1.01 (*)    AST 58 (*)    Total Bilirubin 1.2 (*)    All other components within normal limits  TROPONIN I (HIGH SENSITIVITY) - Abnormal; Notable for the following components:   Troponin I (High Sensitivity) 62 (*)    All other components within normal limits  TROPONIN I (HIGH SENSITIVITY) - Abnormal; Notable for the following components:   Troponin I (High Sensitivity) 52 (*)    All other components within normal limits     EKG Interpreted by me Normal sinus rhythm rate of 71.  Normal axis and intervals QRS ST segments and T waves   RADIOLOGY Chest x-ray interpreted by me, unremarkable.  Radiology report reviewed   PROCEDURES:  Procedures   MEDICATIONS ORDERED IN ED: Medications  albuterol (VENTOLIN HFA) 108 (90 Base) MCG/ACT inhaler 2 puff (has no  administration in time range)  iohexol (OMNIPAQUE) 350 MG/ML injection 75 mL (75 mLs Intravenous Contrast Given 01/06/23 1250)     IMPRESSION / MDM / ASSESSMENT AND PLAN / ED COURSE  I reviewed the triage vital signs and the nursing notes.  DDx: Pneumonia, pleural effusion, pulmonary edema, pneumothorax, non-STEMI  Patient's presentation is most consistent with acute presentation with potential threat to life or bodily function.  Patient presents with dyspnea on exertion, associated with some chest tightness.  Recently treated with steroids and antibiotics without improvement.  Will obtain CT chest to rule out PE, will need to hospitalize for further cardiac workup.  ----------------------------------------- 2:30 PM on 01/06/2023 ----------------------------------------- Case d/w hospitalist     FINAL CLINICAL IMPRESSION(S) / ED DIAGNOSES   Final diagnoses:  DOE (dyspnea on exertion)  Precordial pain     Rx / DC Orders   ED Discharge Orders     None         Note:  This document was prepared using Dragon voice recognition software and may include unintentional dictation errors.   Sharman Cheek, MD 01/06/23 1338    Sharman Cheek, MD 01/06/23 1430

## 2023-01-06 NOTE — ED Triage Notes (Signed)
Pt c/o sob and weakness for the past two weeks. Also having nausea as well.

## 2023-01-06 NOTE — Consult Note (Signed)
Carla Silva is a 57 y.o. female  [id number]  Primary Cardiologist: [facility] cardiology Reason for Consultation: Chest pain  HPI: This is a 57 year old white female with a past medical history of scoliosis depression and anxiety was sent from [facility] clinic for evaluation because of chest pain.  Patient currently not having any chest pain but states that when she exerts she gets tightness in the chest along with severe shortness of breath.  She has been having shortness of breath for the past couple of weeks along with some memory issues.  She says that her husband thinks that she forget a lot.   Review of Systems: No orthopnea PND or leg swelling   Past Medical History:  Diagnosis Date   Anemia    Anxiety    Depression    Gastric ulcer    Scoliosis     (Not in a hospital admission)     enoxaparin (LOVENOX) injection  40 mg Subcutaneous Q24H    Infusions:   Allergies  Allergen Reactions   Macrobid [Nitrofurantoin]     Social History   Socioeconomic History   Marital status: Married    Spouse name: Not on file   Number of children: Not on file   Years of education: Not on file   Highest education level: Not on file  Occupational History   Not on file  Tobacco Use   Smoking status: Former    Types: Cigarettes   Smokeless tobacco: Never  Substance and Sexual Activity   Alcohol use: Not Currently   Drug use: Not Currently    Comment: previous opiates   Sexual activity: Not on file  Other Topics Concern   Not on file  Social History Narrative   Not on file   Social Drivers of Health   Financial Resource Strain: Medium Risk (12/19/2019)   Received from [facility]   Overall Financial Resource Strain (CARDIA)    Difficulty of Paying Living Expenses: Somewhat hard  Food Insecurity: No Food Insecurity (12/19/2019)   Received from [facility], [facility]   Hunger Vital Sign    Worried About Running Out of Food in the Last Year:  Never true    Ran Out of Food in the Last Year: Never true  Transportation Needs: No Transportation Needs (12/19/2019)   Received from [facility], [facility]   [facility] - Transportation    Lack of Transportation (Medical): No    Lack of Transportation (Non-Medical): No  Physical Activity: Not on file  Stress: Not on file  Social Connections: Not on file  Intimate Partner Violence: Not on file    Family History  Problem Relation Age of Onset   Hyperlipidemia Mother    Hypertension Mother    Anuerysm Mother    Hyperlipidemia Father    Heart attack Father    Stroke Maternal Grandmother    Heart attack Maternal Grandfather    Heart attack Paternal Grandmother    Heart attack Paternal Grandfather     PHYSICAL EXAM: Vitals:   01/06/23 1500 01/06/23 1643  BP: (!) 112/92 116/78  Pulse: 83 79  Resp: (!) 22 18  Temp:    SpO2: 98% 98%    No intake or output data in the 24 hours ending 01/06/23 1647  General:  Well appearing. No respiratory difficulty HEENT: normal Neck: supple. no JVD. Carotids 2+ bilat; no bruits. No lymphadenopathy or thryomegaly appreciated. Cor: PMI nondisplaced. Regular rate &  rhythm. No rubs, gallops or murmurs. Lungs: clear Abdomen: soft, nontender, nondistended. No hepatosplenomegaly. No bruits or masses. Good bowel sounds. Extremities: no cyanosis, clubbing, rash, edema Neuro: alert & oriented x 3, cranial nerves grossly intact. moves all 4 extremities w/o difficulty. Affect pleasant.  ECG: Normal sinus rhythm with nonspecific ST-T changes there are  Results for orders placed or performed during the hospital encounter of 01/06/23 (from the past 24 hours)  CBC with Differential     Status: Abnormal   Collection Time: 01/06/23  9:49 AM  Result Value Ref Range   WBC 5.5 4.0 - 10.5 K/uL   RBC 5.15 (H) 3.87 - 5.11 MIL/uL   Hemoglobin 15.5 (H) 12.0 - 15.0 g/dL   HCT 13.0 (H) 86.5 - 78.4 %   MCV 90.1 80.0 - 100.0 fL   MCH 30.1 26.0 - 34.0 pg    MCHC 33.4 30.0 - 36.0 g/dL   RDW 69.6 29.5 - 28.4 %   Platelets 147 (L) 150 - 400 K/uL   nRBC 0.0 0.0 - 0.2 %   Neutrophils Relative % 54 %   Neutro Abs 2.9 1.7 - 7.7 K/uL   Lymphocytes Relative 32 %   Lymphs Abs 1.8 0.7 - 4.0 K/uL   Monocytes Relative 12 %   Monocytes Absolute 0.7 0.1 - 1.0 K/uL   Eosinophils Relative 1 %   Eosinophils Absolute 0.1 0.0 - 0.5 K/uL   Basophils Relative 1 %   Basophils Absolute 0.1 0.0 - 0.1 K/uL   Immature Granulocytes 0 %   Abs Immature Granulocytes 0.02 0.00 - 0.07 K/uL  Comprehensive metabolic panel     Status: Abnormal   Collection Time: 01/06/23  9:49 AM  Result Value Ref Range   Sodium 134 (L) 135 - 145 mmol/L   Potassium 3.7 3.5 - 5.1 mmol/L   Chloride 105 98 - 111 mmol/L   CO2 22 22 - 32 mmol/L   Glucose, Bld 90 70 - 99 mg/dL   BUN 15 6 - 20 mg/dL   Creatinine, Ser 1.32 (H) 0.44 - 1.00 mg/dL   Calcium 9.2 8.9 - 44.0 mg/dL   Total Protein 8.0 6.5 - 8.1 g/dL   Albumin 3.5 3.5 - 5.0 g/dL   AST 58 (H) 15 - 41 U/L   ALT 39 0 - 44 U/L   Alkaline Phosphatase 74 38 - 126 U/L   Total Bilirubin 1.2 (H) <1.2 mg/dL   GFR, Estimated >10 >27 mL/min   Anion gap 7 5 - 15  Troponin I (High Sensitivity)     Status: Abnormal   Collection Time: 01/06/23  9:49 AM  Result Value Ref Range   Troponin I (High Sensitivity) 62 (H) <18 ng/L  Troponin I (High Sensitivity)     Status: Abnormal   Collection Time: 01/06/23 11:49 AM  Result Value Ref Range   Troponin I (High Sensitivity) 52 (H) <18 ng/L   CT Angio Chest PE W and/or Wo Contrast Result Date: 01/06/2023 CLINICAL DATA:  Pulmonary embolism (PE) suspected, high prob EXAM: CT ANGIOGRAPHY CHEST WITH CONTRAST TECHNIQUE: Multidetector CT imaging of the chest was performed using the standard protocol during bolus administration of intravenous contrast. Multiplanar CT image reconstructions and MIPs were obtained to evaluate the vascular anatomy. RADIATION DOSE REDUCTION: This exam was performed according  to the departmental dose-optimization program which includes automated exposure control, adjustment of the mA and/or kV according to patient size and/or use of iterative reconstruction technique. CONTRAST:  75mL OMNIPAQUE IOHEXOL 350 MG/ML  SOLN COMPARISON:  X-ray 01/06/2023 FINDINGS: Cardiovascular: Heart size within normal limits. No significant pericardial effusion although there is some fluid distension of the pericardial recesses. Thoracic aorta is nonaneurysmal. Scattered atherosclerotic vascular calcifications of the aorta and coronary arteries. Pulmonary vasculature is well opacified. There is some streaky artifact in the left main pulmonary artery which also involves the lobar branches of the left upper and left lower lobes. This is felt to be secondary to turbulent flow. No evidence of pulmonary embolism. Pulmonary arteries are mildly dilated. There is distension of the right ventricle with reflux of contrast into the IVC and hepatic veins compatible with right heart dysfunction. Mediastinum/Nodes: No enlarged mediastinal, hilar, or axillary lymph nodes. Thyroid gland, trachea, and esophagus demonstrate no significant findings. Lungs/Pleura: Mild emphysema. Mild bibasilar subsegmental atelectasis, right greater than left. Trace amount of pleural fluid present bilaterally. No pneumothorax. Upper Abdomen: Nodular hepatic surface contour suggestive of cirrhosis. Musculoskeletal: Thoracic levocurvature. Numerous compression fractures throughout the thoracic spine, most severely affecting the T5 and T7 vertebral bodies. Fractures appear to be chronic although the T5 level could potentially be subacute. Healing left ninth rib fracture. Multiple healed right-sided rib fractures. No chest wall abnormality. Review of the MIP images confirms the above findings. IMPRESSION: 1. No evidence of pulmonary embolism. 2. Mild bibasilar subsegmental atelectasis, right greater than left. Trace amount of pleural fluid present  bilaterally. 3. Findings compatible with right heart dysfunction. 4. Numerous compression fractures throughout the thoracic spine, most severely affecting the T5 and T7 vertebral bodies. Fractures appear to be chronic although the T5 level could potentially be subacute. 5. Nodular hepatic surface contour suggestive of cirrhosis. 6. Aortic and coronary artery atherosclerosis (ICD10-I70.0). Electronically Signed   By: Duanne Guess D.O.   On: 01/06/2023 14:09   DG Chest 2 View Result Date: 01/06/2023 CLINICAL DATA:  Two-week history of shortness of breath and weakness EXAM: CHEST - 2 VIEW COMPARISON:  Chest radiograph dated 12/21/2018 FINDINGS: Normal lung volumes. Right basilar patchy and linear opacities. Blunting of the bilateral costophrenic angles. No pneumothorax. The heart size and mediastinal contours are within normal limits. No acute osseous abnormality. IMPRESSION: 1. Right basilar patchy and linear opacities, which may represent atelectasis, aspiration, or pneumonia. 2. Blunting of the bilateral costophrenic angles, which may represent trace pleural effusions or pleural thickening. Electronically Signed   By: Agustin Cree M.D.   On: 01/06/2023 10:52     ASSESSMENT AND PLAN: Atypical chest pain with no acute EKG changes, and troponins 52, 52. Currently has no chest pain. May get echo and out patient nuclear stress test.  Adrian Blackwater

## 2023-01-07 ENCOUNTER — Other Ambulatory Visit: Payer: Self-pay

## 2023-01-07 DIAGNOSIS — R0789 Other chest pain: Secondary | ICD-10-CM

## 2023-01-07 DIAGNOSIS — I509 Heart failure, unspecified: Secondary | ICD-10-CM | POA: Diagnosis not present

## 2023-01-07 DIAGNOSIS — R7989 Other specified abnormal findings of blood chemistry: Secondary | ICD-10-CM

## 2023-01-07 DIAGNOSIS — K7460 Unspecified cirrhosis of liver: Secondary | ICD-10-CM

## 2023-01-07 DIAGNOSIS — S22050A Wedge compression fracture of T5-T6 vertebra, initial encounter for closed fracture: Secondary | ICD-10-CM

## 2023-01-07 DIAGNOSIS — F1290 Cannabis use, unspecified, uncomplicated: Secondary | ICD-10-CM | POA: Diagnosis not present

## 2023-01-07 DIAGNOSIS — R079 Chest pain, unspecified: Secondary | ICD-10-CM | POA: Diagnosis not present

## 2023-01-07 LAB — LIPID PANEL
Cholesterol: 152 mg/dL (ref 0–200)
HDL: 39 mg/dL — ABNORMAL LOW (ref >40)
LDL Cholesterol: 97 mg/dL (ref 0–99)
Total CHOL/HDL Ratio: 3.9 {ratio}
Triglycerides: 82 mg/dL (ref <150)
VLDL: 16 mg/dL (ref 0–40)

## 2023-01-07 LAB — HIV ANTIBODY (ROUTINE TESTING W REFLEX): HIV Screen 4th Generation wRfx: NONREACTIVE

## 2023-01-07 MED ORDER — BIOTIN 1 MG PO CAPS: 1.0000 | ORAL_CAPSULE | Freq: Every day | ORAL | Status: DC

## 2023-01-07 MED ORDER — FUROSEMIDE 10 MG/ML IJ SOLN
20.0000 mg | Freq: Two times a day (BID) | INTRAMUSCULAR | Status: DC
  Administered 2023-01-07 – 2023-01-08 (×3): 20 mg via INTRAVENOUS
  Filled 2023-01-07: qty 4
  Filled 2023-01-07 (×3): qty 2

## 2023-01-07 MED ORDER — MAGNESIUM OXIDE -MG SUPPLEMENT 400 (240 MG) MG PO TABS
400.0000 mg | ORAL_TABLET | Freq: Every day | ORAL | Status: DC
  Administered 2023-01-07 – 2023-01-09 (×3): 400 mg via ORAL
  Filled 2023-01-07 (×3): qty 1

## 2023-01-07 MED ORDER — ADULT MULTIVITAMIN W/MINERALS CH
1.0000 | ORAL_TABLET | Freq: Every day | ORAL | Status: DC
  Administered 2023-01-07 – 2023-01-09 (×3): 1 via ORAL
  Filled 2023-01-07 (×3): qty 1

## 2023-01-07 MED ORDER — OMEGA-3-ACID ETHYL ESTERS 1 G PO CAPS
1.0000 | ORAL_CAPSULE | Freq: Every day | ORAL | Status: DC
  Administered 2023-01-07 – 2023-01-09 (×3): 1 g via ORAL
  Filled 2023-01-07 (×3): qty 1

## 2023-01-07 MED ORDER — CITALOPRAM HYDROBROMIDE 20 MG PO TABS
20.0000 mg | ORAL_TABLET | Freq: Every day | ORAL | Status: DC
Start: 2023-01-07 — End: 2023-01-09
  Administered 2023-01-07 – 2023-01-09 (×3): 20 mg via ORAL
  Filled 2023-01-07 (×3): qty 1

## 2023-01-07 MED ORDER — CYANOCOBALAMIN 500 MCG PO TABS
1000.0000 ug | ORAL_TABLET | Freq: Every day | ORAL | Status: DC
  Administered 2023-01-07 – 2023-01-09 (×3): 1000 ug via ORAL
  Filled 2023-01-07 (×3): qty 2

## 2023-01-07 NOTE — Progress Notes (Signed)
Progress Note   Patient: Carla Silva BJY:[id number] DOB: 1965/04/08 DOA: 01/06/2023     1 DOS: the patient was seen and examined on 01/07/2023   Brief hospital course: Carla Silva is a 57 y.o. female with medical history significant of anxiety depression, marijuana use presenting with exertional chest pain, shortness of breath occasional palpitations.  Her troponin remained flat admitted to [facility] service for further management. Cardiology advised Echo, trial of IV lasix.   Assessment and Plan: * Chest pain Recurrent exertional chest pain/pressure and SOB with progressive worsening of the past 2 weeks CTA negative for PE but is showing concern for right heart dysfunction. BNP elevated, Troponin flat 50s to 60s. EKG normal sinus rhythm with nonspecific changes Cardiology follow-up is appreciated. Trial of IV Lasix 20 mg twice daily. Echocardiogram pending.  Cirrhosis (HCC) Noted cirrhotic liver changes on CT  No reported ETOH use  LFTs grossly stable  Outpatient follow-up suggested  Thoracic compression fracture (HCC) Noted numerous compression fractures throughout the thoracic spine most severely affecting the T5 and T7 on imaging  Suspect partial contribution to overall pain on presentation  Dr. Katrinka Blazing w/ NSG recommended TLSO brace.  She did mention that she knows about prior fractures of thoracic vertebrae. PT/OT eval and follow-up. Pain control   Marijuana use Regular marijuana use  Discussed about L effects of illicit drugs.  She will need outpatient follow-up for her right elbow fracture repair.       Out of bed to chair. Incentive spirometry. Nursing supportive care. Fall, aspiration precautions. DVT prophylaxis   Code Status: Full Code  Subjective: Patient is seen and examined today morning.  She feels exertional dyspnea, did not get out of bed.  Eating fair.  Husband at bedside.  Discussed with her regarding multiple thoracic fractures, she does not  think it is attributing to her current symptoms.  Physical Exam: Vitals:   01/07/23 0709 01/07/23 0714 01/07/23 0800 01/07/23 1115  BP:  (!) 114/98 (!) 108/93 (!) 108/93  Pulse:  73 81 95  Resp:  15 18 14   Temp: 98.1 F (36.7 C) 98.2 F (36.8 C)  98.3 F (36.8 C)  TempSrc: Oral Oral  Oral  SpO2:  98% 99% 97%  Weight:      Height:        General - Elderly thin built Caucasian female, distress, anxiety HEENT - PERRLA, EOMI, atraumatic head, non tender sinuses. Lung - Clear, basal rales, no rhonchi, wheezes. Heart - S1, S2 heard, no murmurs, rubs, no pedal edema. Abdomen - Soft, non tender, nondistended, bowel sounds good Neuro - Alert, awake and oriented x 3, non focal exam. Skin - Warm and dry.  Data Reviewed:      Latest Ref Rng & Units 01/06/2023    9:49 AM 12/21/2022    5:29 PM 01/26/2021    6:12 PM  CBC  WBC 4.0 - 10.5 K/uL 5.5  4.4  4.0   Hemoglobin 12.0 - 15.0 g/dL 08.6  57.8  46.9   Hematocrit 36.0 - 46.0 % 46.4  44.1  41.0   Platelets 150 - 400 K/uL 147  157  160       Latest Ref Rng & Units 01/06/2023    9:49 AM 12/21/2022    5:29 PM 01/26/2021    6:12 PM  BMP  Glucose 70 - 99 mg/dL 90  84  91   BUN 6 - 20 mg/dL 15  16  15    Creatinine 0.44 - 1.00  mg/dL 1.61  0.96  0.45   Sodium 135 - 145 mmol/L 134  137  138   Potassium 3.5 - 5.1 mmol/L 3.7  3.8  4.2   Chloride 98 - 111 mmol/L 105  107  106   CO2 22 - 32 mmol/L 22  23  23    Calcium 8.9 - 10.3 mg/dL 9.2  9.6  9.8    CT Angio Chest PE W and/or Wo Contrast Result Date: 01/06/2023 CLINICAL DATA:  Pulmonary embolism (PE) suspected, high prob EXAM: CT ANGIOGRAPHY CHEST WITH CONTRAST TECHNIQUE: Multidetector CT imaging of the chest was performed using the standard protocol during bolus administration of intravenous contrast. Multiplanar CT image reconstructions and MIPs were obtained to evaluate the vascular anatomy. RADIATION DOSE REDUCTION: This exam was performed according to the departmental  dose-optimization program which includes automated exposure control, adjustment of the mA and/or kV according to patient size and/or use of iterative reconstruction technique. CONTRAST:  75mL OMNIPAQUE IOHEXOL 350 MG/ML SOLN COMPARISON:  X-ray 01/06/2023 FINDINGS: Cardiovascular: Heart size within normal limits. No significant pericardial effusion although there is some fluid distension of the pericardial recesses. Thoracic aorta is nonaneurysmal. Scattered atherosclerotic vascular calcifications of the aorta and coronary arteries. Pulmonary vasculature is well opacified. There is some streaky artifact in the left main pulmonary artery which also involves the lobar branches of the left upper and left lower lobes. This is felt to be secondary to turbulent flow. No evidence of pulmonary embolism. Pulmonary arteries are mildly dilated. There is distension of the right ventricle with reflux of contrast into the IVC and hepatic veins compatible with right heart dysfunction. Mediastinum/Nodes: No enlarged mediastinal, hilar, or axillary lymph nodes. Thyroid gland, trachea, and esophagus demonstrate no significant findings. Lungs/Pleura: Mild emphysema. Mild bibasilar subsegmental atelectasis, right greater than left. Trace amount of pleural fluid present bilaterally. No pneumothorax. Upper Abdomen: Nodular hepatic surface contour suggestive of cirrhosis. Musculoskeletal: Thoracic levocurvature. Numerous compression fractures throughout the thoracic spine, most severely affecting the T5 and T7 vertebral bodies. Fractures appear to be chronic although the T5 level could potentially be subacute. Healing left ninth rib fracture. Multiple healed right-sided rib fractures. No chest wall abnormality. Review of the MIP images confirms the above findings. IMPRESSION: 1. No evidence of pulmonary embolism. 2. Mild bibasilar subsegmental atelectasis, right greater than left. Trace amount of pleural fluid present bilaterally. 3.  Findings compatible with right heart dysfunction. 4. Numerous compression fractures throughout the thoracic spine, most severely affecting the T5 and T7 vertebral bodies. Fractures appear to be chronic although the T5 level could potentially be subacute. 5. Nodular hepatic surface contour suggestive of cirrhosis. 6. Aortic and coronary artery atherosclerosis (ICD10-I70.0). Electronically Signed   By: Duanne Guess D.O.   On: 01/06/2023 14:09   DG Chest 2 View Result Date: 01/06/2023 CLINICAL DATA:  Two-week history of shortness of breath and weakness EXAM: CHEST - 2 VIEW COMPARISON:  Chest radiograph dated 12/21/2018 FINDINGS: Normal lung volumes. Right basilar patchy and linear opacities. Blunting of the bilateral costophrenic angles. No pneumothorax. The heart size and mediastinal contours are within normal limits. No acute osseous abnormality. IMPRESSION: 1. Right basilar patchy and linear opacities, which may represent atelectasis, aspiration, or pneumonia. 2. Blunting of the bilateral costophrenic angles, which may represent trace pleural effusions or pleural thickening. Electronically Signed   By: Agustin Cree M.D.   On: 01/06/2023 10:52     Family Communication: Discussed with patient and her husband at bedside, they understand and agree. All questions  answereed.  Disposition: Status is: Inpatient Remains inpatient appropriate because: dyspnea with exertion, IV lasix, echo pending.  Planned Discharge Destination: Home     Time spent: 38 minutes  Author: Marcelino Duster, MD 01/07/2023 2:01 PM Secure chat 7am to 7pm For on call review [email or web address].

## 2023-01-07 NOTE — ED Notes (Signed)
Ortho Tech at [facility] called at this time on status on TLSO brace.

## 2023-01-07 NOTE — Progress Notes (Signed)
Physical Therapy Evaluation Patient Details Name: Carla Silva MRN: [id number] DOB: 07-25-1965 Today's Date: 01/07/2023  History of Present Illness  Carla Silva is a 57 y.o. female with medical history significant of anxiety depression, marijuana use presenting with exertional chest pain, shortness of breath occasional palpitations. CTA negative for PE but is showing concern for right heart dysfunction. Noted numerous compression fractures throughout the thoracic spine most severely affecting the T5 and T7 on imaging. Dr. Katrinka Blazing w/ NSG recommended TLSO brace for comfort only.  She did mention that she knows about prior fractures of thoracic vertebrae.   Clinical Impression  Patient received reclining in bed and concerned about her diastolic blood pressure reading 105 mmHg at last reading. RN in room and reassured her it is okay for her to do basic mobility as needed for PT evaluation. Patient alert and oriented and able to provide detailed history. She lives with her husband who works in a single story home with 3 steps and no handrail to enter. Prior to hospitalization she was I with all aspects of care and mobility and was driving. She has noticed increasing shortness of breath that limits her walking distance and functional activity tolerance that is concerning her and is not at her baseline. She reports one fall in the last 6 months. Upon PT evaluation, patient was independent for bed mobility, transfers, and ambulation with no AD for 130 feet. She was limited in her ambulation by feeling of shortness of breath and her HR rose to 144 bpm while walking. SpO2 remained WFL and HR returned to functional range with rest. She has no acute PT needs at this time. However, she is not at her usual functional level due to the cardiorespiratory symptoms. Patient is now discharged from PT caseload at this time.         If plan is discharge home, recommend the following: Other (comment) (no assistance  needed)   Can travel by private vehicle    Yes     Equipment Recommendations None recommended by PT  Recommendations for Other Services       Functional Status Assessment Patient has had a recent decline in their functional status and demonstrates the ability to make significant improvements in function in a reasonable and predictable amount of time. (patient's limitations are related to cardiovascular function and she may benefit from outpatient PT to regain prior level of function once underlying condition is addressed medically)     Precautions / Restrictions Precautions Precautions: None Required Braces or Orthoses:  (TSLO for comfort only) Restrictions Weight Bearing Restrictions Per Provider Order: No      Mobility  Bed Mobility Overal bed mobility: Modified Independent             General bed mobility comments: supine <> sit mod I on flat bed    Transfers Overall transfer level: Independent Equipment used: None               General transfer comment: sit <> stand from/to bed    Ambulation/Gait Ambulation/Gait assistance: Independent Gait Distance (Feet): 130 Feet Assistive device: None Gait Pattern/deviations: WFL(Within Functional Limits) Gait velocity: WNL     General Gait Details: Patient ambulated approx 130 feet with no AD. She turned towards PT walking next to her and talked most of the way. She noticed something on the floor and stooped to pick it up without loss of balance. She did have shortness of breath and HR rose to 144 bpm during ambulation. SpO2  remained in high 90s.  Stairs            Wheelchair Mobility     Tilt Bed    Modified Rankin (Stroke Patients Only)       Balance Overall balance assessment: Independent                                           Pertinent Vitals/Pain Pain Assessment Pain Assessment: 0-10 Pain Score: 1  (pt reports it is her baseline chronic pain) Pain Location: thoracic  spine Pain Descriptors / Indicators: Aching, Discomfort, Dull Pain Intervention(s): Limited activity within patient's tolerance, Monitored during session    Home Living Family/patient expects to be discharged to:: Private residence Living Arrangements: Spouse/significant other Available Help at Discharge: Family;Friend(s);Available PRN/intermittently (husband works, Network engineer can come over)   Home Access: Stairs to enter Entrance Stairs-Rails: None Secretary/administrator of Steps: 3   Home Layout: One level Home Equipment: None      Prior Function Prior Level of Function : Independent/Modified Independent;Driving;History of Falls (last six months)             Mobility Comments: Patient reports being I with all aspects of care and mobility. However, she did have one fall on 11/14 before her last elbow surgery. She is used to [employer] and being very active with her grandchildren, but she has been getting increasingly short of breath and was unable to make it even down to the stop sign and back in her community recently. She states she usually goes for much longer walks regularly. ADLs Comments: I with all ADLs and IADLs, although she does have to stop and take breaks between activities when doing things like housework     Extremity/Trunk Assessment   Upper Extremity Assessment Upper Extremity Assessment: Overall WFL for tasks assessed    Lower Extremity Assessment Lower Extremity Assessment: Overall WFL for tasks assessed    Cervical / Trunk Assessment Cervical / Trunk Assessment: Normal  Communication   Communication Communication: No apparent difficulties Cueing Techniques: Verbal cues  Cognition Arousal: Alert Behavior During Therapy: WFL for tasks assessed/performed, Anxious Overall Cognitive Status: Within Functional Limits for tasks assessed                                          General Comments      Exercises Other Exercises Other  Exercises: educated pt on use of TSLO and offered to review donning/doffing (patient declined noting she knows how to do it already; she also declined using it during today's session although she states it does improve her pain). Other Exercises: educated on role of PT in acute care setting   Assessment/Plan    PT Assessment Patient does not need any further PT services  PT Problem List         PT Treatment Interventions      PT Goals (Current goals can be found in the Care Plan section)  Acute Rehab PT Goals Patient Stated Goal: find out what is wrong with her and her heart and get appropriate treatment PT Goal Formulation: With patient Time For Goal Achievement: 01/21/23 Potential to Achieve Goals: Good    Frequency       Co-evaluation  AM-PAC PT "6 Clicks" Mobility  Outcome Measure Help needed turning from your back to your side while in a flat bed without using bedrails?: None Help needed moving from lying on your back to sitting on the side of a flat bed without using bedrails?: None Help needed moving to and from a bed to a chair (including a wheelchair)?: None Help needed standing up from a chair using your arms (e.g., wheelchair or bedside chair)?: None Help needed to walk in hospital room?: None Help needed climbing 3-5 steps with a railing? : None 6 Click Score: 24    End of Session   Activity Tolerance: Patient limited by fatigue;Patient tolerated treatment well Patient left: in bed;with call bell/phone within reach Nurse Communication: Mobility status PT Visit Diagnosis: History of falling (Z91.81);Difficulty in walking, not elsewhere classified (R26.2)    Time: 1610-9604 PT Time Calculation (min) (ACUTE ONLY): 12 min   Charges:   PT Evaluation $PT Eval Low Complexity: 1 Low   PT General Charges $$ ACUTE PT VISIT: 1 Visit       Huntley Dec R. Ilsa Iha, PT, DPT 01/07/23, 4:42 PM

## 2023-01-07 NOTE — Progress Notes (Signed)
SUBJECTIVE: Patient was admitted yesterday because of chest pain and shortness of breath on exertion.  Patient is feeling somewhat better compared to yesterday but says when she exerts she gets worse.   Vitals:   01/07/23 0709 01/07/23 0714 01/07/23 0800 01/07/23 1115  BP:  (!) 114/98 (!) 108/93 (!) 108/93  Pulse:  73 81 95  Resp:  15 18 14   Temp: 98.1 F (36.7 C) 98.2 F (36.8 C)  98.3 F (36.8 C)  TempSrc: Oral Oral  Oral  SpO2:  98% 99% 97%  Weight:      Height:       No intake or output data in the 24 hours ending 01/07/23 1337  LABS: Basic Metabolic Panel: Recent Labs    01/06/23 0949  NA 134*  K 3.7  CL 105  CO2 22  GLUCOSE 90  BUN 15  CREATININE 1.01*  CALCIUM 9.2   Liver Function Tests: Recent Labs    01/06/23 0949  AST 58*  ALT 39  ALKPHOS 74  BILITOT 1.2*  PROT 8.0  ALBUMIN 3.5   No results for input(s): "LIPASE", "AMYLASE" in the last 72 hours. CBC: Recent Labs    01/06/23 0949  WBC 5.5  NEUTROABS 2.9  HGB 15.5*  HCT 46.4*  MCV 90.1  PLT 147*   Cardiac Enzymes: No results for input(s): "CKTOTAL", "CKMB", "CKMBINDEX", "TROPONINI" in the last 72 hours. BNP: Invalid input(s): "POCBNP" D-Dimer: No results for input(s): "DDIMER" in the last 72 hours. Hemoglobin A1C: No results for input(s): "HGBA1C" in the last 72 hours. Fasting Lipid Panel: Recent Labs    01/07/23 0209  CHOL 152  HDL 39*  LDLCALC 97  TRIG 82  CHOLHDL 3.9   Thyroid Function Tests: Recent Labs    01/06/23 1807  TSH 5.070*   Anemia Panel: No results for input(s): "VITAMINB12", "FOLATE", "FERRITIN", "TIBC", "IRON", "RETICCTPCT" in the last 72 hours.   PHYSICAL EXAM General: Well developed, well nourished, in no acute distress HEENT:  Normocephalic and atramatic Neck:  No JVD.  Lungs: Clear bilaterally to auscultation and percussion. Heart: HRRR . Normal S1 and S2 without gallops or murmurs.  Abdomen: Bowel sounds are positive, abdomen soft and non-tender   Msk:  Back normal, normal gait. Normal strength and tone for age. Extremities: No clubbing, cyanosis or edema.   Neuro: Alert and oriented X 3. Psych:  Good affect, responds appropriately  TELEMETRY: Sinus rhythm  ASSESSMENT AND PLAN: Dyspnea on exertion with intermittent chest pain.  Chest pain has resolved with no acute EKG changes and borderline elevation of troponin of 5858 and 52.  However BNP was 1698.2.  Will start the patient on Lasix while echo is pending.  May have HFpEF.   ICD-10-CM   1. DOE (dyspnea on exertion)  R06.09     2. Precordial pain  R07.2       Principal Problem:   Chest pain Active Problems:   Marijuana use   Thoracic compression fracture (HCC)   Cirrhosis (HCC)    Adrian Blackwater, MD, [facility] 01/07/2023 1:37 PM

## 2023-01-07 NOTE — Progress Notes (Signed)
Orthopedic Tech Progress Note Patient Details:  Carla Silva 05/20/1965 [id number] TLSO brace has been ordered from [facility]  Patient ID: [name], female   DOB: 05-27-1965, 57 y.o.   MRN: [id number]  Genelle Bal Ibraham Levi 01/07/2023, 9:08 AM

## 2023-01-08 DIAGNOSIS — I509 Heart failure, unspecified: Secondary | ICD-10-CM | POA: Diagnosis not present

## 2023-01-08 DIAGNOSIS — S22050A Wedge compression fracture of T5-T6 vertebra, initial encounter for closed fracture: Secondary | ICD-10-CM | POA: Diagnosis not present

## 2023-01-08 DIAGNOSIS — R079 Chest pain, unspecified: Secondary | ICD-10-CM | POA: Diagnosis not present

## 2023-01-08 DIAGNOSIS — R0789 Other chest pain: Secondary | ICD-10-CM | POA: Diagnosis not present

## 2023-01-08 DIAGNOSIS — K7460 Unspecified cirrhosis of liver: Secondary | ICD-10-CM | POA: Diagnosis not present

## 2023-01-08 DIAGNOSIS — F1290 Cannabis use, unspecified, uncomplicated: Secondary | ICD-10-CM | POA: Diagnosis not present

## 2023-01-08 LAB — BASIC METABOLIC PANEL
Anion gap: 10 (ref 5–15)
BUN: 12 mg/dL (ref 6–20)
CO2: 22 mmol/L (ref 22–32)
Calcium: 9.4 mg/dL (ref 8.9–10.3)
Chloride: 102 mmol/L (ref 98–111)
Creatinine, Ser: 0.97 mg/dL (ref 0.44–1.00)
GFR, Estimated: >60 mL/min (ref >60)
Glucose, Bld: 126 mg/dL — ABNORMAL HIGH (ref 70–99)
Potassium: 3.7 mmol/L (ref 3.5–5.1)
Sodium: 134 mmol/L — ABNORMAL LOW (ref 135–145)

## 2023-01-08 LAB — T4, FREE: Free T4: 0.94 ng/dL (ref 0.61–1.12)

## 2023-01-08 MED ORDER — DAPAGLIFLOZIN PROPANEDIOL 10 MG PO TABS
10.0000 mg | ORAL_TABLET | Freq: Every day | ORAL | Status: DC
  Administered 2023-01-09: 10 mg via ORAL
  Filled 2023-01-08: qty 1

## 2023-01-08 MED ORDER — AMLODIPINE BESYLATE 5 MG PO TABS
5.0000 mg | ORAL_TABLET | Freq: Every day | ORAL | Status: DC
  Administered 2023-01-08 – 2023-01-09 (×2): 5 mg via ORAL
  Filled 2023-01-08 (×2): qty 1

## 2023-01-08 NOTE — Evaluation (Signed)
Occupational Therapy Evaluation Patient Details Name: Carla Silva MRN: [id number] DOB: 1965/04/19 Today's Date: 01/08/2023   History of Present Illness Carla Silva is a 57 y.o. female with medical history significant of anxiety depression, marijuana use presenting with exertional chest pain, shortness of breath occasional palpitations. CTA negative for PE but is showing concern for right heart dysfunction. Noted numerous compression fractures throughout the thoracic spine most severely affecting the T5 and T7 on imaging. Dr. Katrinka Blazing w/ NSG recommended TLSO brace for comfort only.  She did mention that she knows about prior fractures of thoracic vertebrae.   Clinical Impression   Chart reviewed, pt greeted in room with husband present, agreeable to OT evaluation. Pt is alert and oriented x4. PTA pt reports MOD I-I in ADL/IADL, recently unable to tolerate walking long distances due to SOB per her report. Pt performs ADL/functional mobility including TLSO application with MOD I on this date. Educated pt/husband re: compensatory techniques/use of AE for improved ADL performance. No acute OT needs identified at this time, OT will sign off. Please re consult if there is a change in functional status. Of note vitals taken are as follows:   Prior to mobility Seated on edge of bed: BP 121/93 (MAP 102) HR 88 bpm, spo2 95% on RA  After mobility seated on edge of bed 151/116 (MAP of 128), HR 96 bpm, spo2 95% on RA       If plan is discharge home, recommend the following: Assistance with cooking/housework    Functional Status Assessment  Patient has had a recent decline in their functional status and demonstrates the ability to make significant improvements in function in a reasonable and predictable amount of time.  Equipment Recommendations  Tub/shower seat    Recommendations for Other Services       Precautions / Restrictions Precautions Required Braces or Orthoses:  (TLSO for  comfort) Restrictions Weight Bearing Restrictions Per Provider Order: No      Mobility Bed Mobility Overal bed mobility: Modified Independent                  Transfers Overall transfer level: Independent                        Balance Overall balance assessment: Independent                                         ADL either performed or assessed with clinical judgement   ADL Overall ADL's : Modified independent                                       General ADL Comments: MOD I for toilet transfers, toileting, LB dressing, TLSO application; Discussed compensatory techniques/use of AE for increased ease of ADLs (ie reacher, sock aid), amb approx 160' with TLSO donned with MOD I-I     Vision Patient Visual Report: No change from baseline       Perception         Praxis         Pertinent Vitals/Pain Pain Assessment Pain Assessment: 0-10 Pain Score: 5  Pain Location: HA on the top of her head Pain Intervention(s): Monitored during session, Repositioned (notified nurse and doctor)     Extremity/Trunk Assessment Upper Extremity Assessment  Upper Extremity Assessment: Overall WFL for tasks assessed   Lower Extremity Assessment Lower Extremity Assessment: Overall WFL for tasks assessed       Communication Communication Communication: No apparent difficulties   Cognition Arousal: Alert Behavior During Therapy: WFL for tasks assessed/performed Overall Cognitive Status: Within Functional Limits for tasks assessed                                       General Comments       Exercises Other Exercises Other Exercises: edu re: role of OT, role of rehab, dishcarge recommendations, TLSO use, increased ease of ADL completion   Shoulder Instructions      Home Living Family/patient expects to be discharged to:: Private residence Living Arrangements: Spouse/significant other Available Help at  Discharge: Family;Friend(s);Available PRN/intermittently Type of Home: House Home Access: Stairs to enter [employer] of Steps: 3 Entrance Stairs-Rails: None Home Layout: One level     Bathroom Shower/Tub: Producer, television/film/video: Handicapped height     Home Equipment: None          Prior Functioning/Environment Prior Level of Function : Independent/Modified Independent;Driving;History of Falls (last six months)             Mobility Comments: Patient reports being I with all aspects of care and mobility. However, she did have one fall on 11/14 before her last elbow surgery. She is used to [employer] and being very active with her grandchildren, but she has been getting increasingly short of breath and was unable to make it even down to the stop sign and back in her community recently. She states she usually goes for much longer walks regularly. ADLs Comments: I, now MOD I for ADL/IADL due to increased SOB during activities        OT Problem List:        OT Treatment/Interventions:      OT Goals(Current goals can be found in the care plan section) Acute Rehab OT Goals Patient Stated Goal: return to PLOF OT Goal Formulation: With patient  OT Frequency:      Co-evaluation              AM-PAC OT "6 Clicks" Daily Activity     Outcome Measure Help from another person eating meals?: None Help from another person taking care of personal grooming?: None Help from another person toileting, which includes using toliet, bedpan, or urinal?: None Help from another person bathing (including washing, rinsing, drying)?: None Help from another person to put on and taking off regular upper body clothing?: None Help from another person to put on and taking off regular lower body clothing?: None 6 Click Score: 24   End of Session Nurse Communication: Mobility status;Other (comment) (HA/vital signs)  Activity Tolerance: Patient tolerated treatment  well Patient left: in bed;with call bell/phone within reach;with family/visitor present  OT Visit Diagnosis: Other abnormalities of gait and mobility (R26.89)                Time: 1610-9604 OT Time Calculation (min): 22 min Charges:  OT General Charges $OT Visit: 1 Visit OT Evaluation $OT Eval Low Complexity: 1 Low  Oleta Mouse, OTD OTR/L  01/08/23, 1:08 PM

## 2023-01-08 NOTE — Progress Notes (Signed)
SUBJECTIVE: Patient is feeling less short of breath and has no further chest pain.   Vitals:   01/07/23 1951 01/07/23 2108 01/08/23 0820 01/08/23 1308  BP: (!) 124/103 (!) 125/94 (!) 136/111 (!) 151/116  Pulse: 85 87 97   Resp: 15 20 17    Temp: 97.9 F (36.6 C) 98 F (36.7 C) 98.1 F (36.7 C)   TempSrc: Oral Oral    SpO2: 95% 95% 94%   Weight:      Height:       No intake or output data in the 24 hours ending 01/08/23 1333  LABS: Basic Metabolic Panel: Recent Labs    01/06/23 0949 01/08/23 0954  NA 134* 134*  K 3.7 3.7  CL 105 102  CO2 22 22  GLUCOSE 90 126*  BUN 15 12  CREATININE 1.01* 0.97  CALCIUM 9.2 9.4   Liver Function Tests: Recent Labs    01/06/23 0949  AST 58*  ALT 39  ALKPHOS 74  BILITOT 1.2*  PROT 8.0  ALBUMIN 3.5   No results for input(s): "LIPASE", "AMYLASE" in the last 72 hours. CBC: Recent Labs    01/06/23 0949  WBC 5.5  NEUTROABS 2.9  HGB 15.5*  HCT 46.4*  MCV 90.1  PLT 147*   Cardiac Enzymes: No results for input(s): "CKTOTAL", "CKMB", "CKMBINDEX", "TROPONINI" in the last 72 hours. BNP: Invalid input(s): "POCBNP" D-Dimer: No results for input(s): "DDIMER" in the last 72 hours. Hemoglobin A1C: No results for input(s): "HGBA1C" in the last 72 hours. Fasting Lipid Panel: Recent Labs    01/07/23 0209  CHOL 152  HDL 39*  LDLCALC 97  TRIG 82  CHOLHDL 3.9   Thyroid Function Tests: Recent Labs    01/06/23 1807  TSH 5.070*   Anemia Panel: No results for input(s): "VITAMINB12", "FOLATE", "FERRITIN", "TIBC", "IRON", "RETICCTPCT" in the last 72 hours.   PHYSICAL EXAM General: Well developed, well nourished, in no acute distress HEENT:  Normocephalic and atramatic Neck:  No JVD.  Lungs: Clear bilaterally to auscultation and percussion. Heart: HRRR . Normal S1 and S2 without gallops or murmurs.  Abdomen: Bowel sounds are positive, abdomen soft and non-tender  Msk:  Back normal, normal gait. Normal strength and tone for  age. Extremities: No clubbing, cyanosis or edema.   Neuro: Alert and oriented X 3. Psych:  Good affect, responds appropriately  TELEMETRY: Normal sinus rhythm  ASSESSMENT AND PLAN: #1 chest pain with mildly elevated troponin and nonspecific ST-T changes on EKG.  Advise outpatient nuclear stress test to evaluate for coronary artery disease. #2 congestive heart failure, has dyspnea on exertion, still waiting for echocardiogram but is feeling better on starting the patient on Lasix and [place].   ICD-10-CM   1. DOE (dyspnea on exertion)  R06.09     2. Precordial pain  R07.2       Principal Problem:   Chest pain Active Problems:   Marijuana use   Thoracic compression fracture (HCC)   Cirrhosis (HCC)    Adrian Blackwater, MD, [facility] 01/08/2023 1:33 PM

## 2023-01-08 NOTE — Progress Notes (Signed)
Patient with complaints of chest tightness across front chest.  EKG completed and shows sinus rhythm.  Dr. Clide Dales made aware, no new orders received.

## 2023-01-08 NOTE — Plan of Care (Signed)

## 2023-01-08 NOTE — Progress Notes (Signed)
Progress Note   Patient: Carla Silva ZOX:[id number] DOB: 1965-04-03 DOA: 01/06/2023     2 DOS: the patient was seen and examined on 01/08/2023   Brief hospital course: VALA AVANCE is a 57 y.o. female with medical history significant of anxiety depression, marijuana use presenting with exertional chest pain, shortness of breath occasional palpitations.  Her troponin remained flat admitted to [facility] service for further management. Cardiology advised Echo, trial of IV lasix.   Assessment and Plan: * Chest pain Exertional dyspnea CTA negative for PE but is showing concern for right heart dysfunction. BNP elevated, Troponin flat 50s to 60s. EKG normal sinus rhythm with nonspecific changes Cardiology follow-up is appreciated. Continue IV Lasix 20 mg twice daily. Daily weights, strict input, output. Echocardiogram still pending.  Cirrhosis (HCC) Noted cirrhotic liver changes on CT  No reported ETOH use  LFTs grossly stable  Outpatient follow-up suggested  Thoracic compression fracture (HCC) Noted numerous compression fractures throughout the thoracic spine most severely affecting the T5 and T7 on imaging  Suspect partial contribution to overall pain on presentation  Dr. Katrinka Blazing w/ NSG recommended TLSO brace.  She did mention that she knows about prior fractures of thoracic vertebrae. PT/OT eval and follow-up. Pain control   Elevated BP No prior h/o hypertension. Will start Norvasc 5mg  daily. Monitor BP closely.  Marijuana use Discussed about ill effects of illicit drugs.  She will need outpatient follow-up for her right elbow fracture repair.       Out of bed to chair. Incentive spirometry. Nursing supportive care. Fall, aspiration precautions. DVT prophylaxis Lovenox   Code Status: Full Code  Subjective: Patient is seen and examined today morning.  She has headache. BP borderline high and she is worried. PT worked with her. She asked about echo which is pending  for 2 days.  Physical Exam: Vitals:   01/07/23 1951 01/07/23 2108 01/08/23 0820 01/08/23 1308  BP: (!) 124/103 (!) 125/94 (!) 136/111 (!) 151/116  Pulse: 85 87 97   Resp: 15 20 17    Temp: 97.9 F (36.6 C) 98 F (36.7 C) 98.1 F (36.7 C)   TempSrc: Oral Oral    SpO2: 95% 95% 94%   Weight:      Height:        General - Elderly thin built Caucasian female, distress, anxiety HEENT - PERRLA, EOMI, atraumatic head, non tender sinuses. Lung - Clear, basal rales, no rhonchi, wheezes. Heart - S1, S2 heard, no murmurs, rubs, no pedal edema. Abdomen - Soft, non tender, nondistended, bowel sounds good Neuro - Alert, awake and oriented x 3, non focal exam. Skin - Warm and dry.  Data Reviewed:      Latest Ref Rng & Units 01/06/2023    9:49 AM 12/21/2022    5:29 PM 01/26/2021    6:12 PM  CBC  WBC 4.0 - 10.5 K/uL 5.5  4.4  4.0   Hemoglobin 12.0 - 15.0 g/dL 81.1  91.4  78.2   Hematocrit 36.0 - 46.0 % 46.4  44.1  41.0   Platelets 150 - 400 K/uL 147  157  160       Latest Ref Rng & Units 01/08/2023    9:54 AM 01/06/2023    9:49 AM 12/21/2022    5:29 PM  BMP  Glucose 70 - 99 mg/dL 956  90  84   BUN 6 - 20 mg/dL 12  15  16    Creatinine 0.44 - 1.00 mg/dL 2.13  0.86  5.78  Sodium 135 - 145 mmol/L 134  134  137   Potassium 3.5 - 5.1 mmol/L 3.7  3.7  3.8   Chloride 98 - 111 mmol/L 102  105  107   CO2 22 - 32 mmol/L 22  22  23    Calcium 8.9 - 10.3 mg/dL 9.4  9.2  9.6    No results found.  Family Communication: Discussed with patient and her husband at bedside, they understand and agree. All questions answereed.  Disposition: Status is: Inpatient Remains inpatient appropriate because: dyspnea with exertion, IV lasix, echo pending.  Planned Discharge Destination: Home     Time spent: 38 minutes  Author: Marcelino Duster, MD 01/08/2023 1:29 PM Secure chat 7am to 7pm For on call review [email or web address].

## 2023-01-08 NOTE — Plan of Care (Signed)
  Problem: Education: Goal: Understanding of cardiac disease, CV risk reduction, and recovery process will improve Outcome: Progressing Goal: Individualized Educational Video(s) Outcome: Progressing   Problem: Activity: Goal: Ability to tolerate increased activity will improve Outcome: Progressing   

## 2023-01-09 ENCOUNTER — Inpatient Hospital Stay
Admit: 2023-01-09 | Discharge: 2023-01-09 | Disposition: A | Discharge status: Home / Self Care | Payer: 59 | Attending: Internal Medicine | Admitting: Internal Medicine

## 2023-01-09 DIAGNOSIS — I272 Pulmonary hypertension, unspecified: Secondary | ICD-10-CM | POA: Insufficient documentation

## 2023-01-09 DIAGNOSIS — I5031 Acute diastolic (congestive) heart failure: Secondary | ICD-10-CM | POA: Insufficient documentation

## 2023-01-09 DIAGNOSIS — I10 Essential (primary) hypertension: Secondary | ICD-10-CM | POA: Diagnosis not present

## 2023-01-09 DIAGNOSIS — S22050A Wedge compression fracture of T5-T6 vertebra, initial encounter for closed fracture: Secondary | ICD-10-CM | POA: Diagnosis not present

## 2023-01-09 LAB — ECHOCARDIOGRAM COMPLETE
AR max vel: 2.96 cm2
AV Area VTI: 3.01 cm2
AV Area mean vel: 2.51 cm2
AV Mean grad: 2 mm[Hg]
AV Peak grad: 2.4 mm[Hg]
Ao pk vel: 0.78 m/s
Area-P 1/2: 4.89 cm2
Height: 63 in
S' Lateral: 2.2 cm
Weight: 1855.39 [oz_av]

## 2023-01-09 LAB — CBC
HCT: 41 % (ref 36.0–46.0)
Hemoglobin: 14.1 g/dL (ref 12.0–15.0)
MCH: 29.7 pg (ref 26.0–34.0)
MCHC: 34.4 g/dL (ref 30.0–36.0)
MCV: 86.3 fL (ref 80.0–100.0)
Platelets: 114 10*3/uL — ABNORMAL LOW (ref 150–400)
RBC: 4.75 MIL/uL (ref 3.87–5.11)
RDW: 13.1 % (ref 11.5–15.5)
WBC: 4.7 10*3/uL (ref 4.0–10.5)
nRBC: 0 % (ref 0.0–0.2)

## 2023-01-09 LAB — BASIC METABOLIC PANEL
Anion gap: 11 (ref 5–15)
BUN: 16 mg/dL (ref 6–20)
CO2: 19 mmol/L — ABNORMAL LOW (ref 22–32)
Calcium: 8.7 mg/dL — ABNORMAL LOW (ref 8.9–10.3)
Chloride: 100 mmol/L (ref 98–111)
Creatinine, Ser: 0.87 mg/dL (ref 0.44–1.00)
GFR, Estimated: >60 mL/min (ref >60)
Glucose, Bld: 108 mg/dL — ABNORMAL HIGH (ref 70–99)
Potassium: 3.3 mmol/L — ABNORMAL LOW (ref 3.5–5.1)
Sodium: 130 mmol/L — ABNORMAL LOW (ref 135–145)

## 2023-01-09 MED ORDER — DAPAGLIFLOZIN PROPANEDIOL 10 MG PO TABS: 10.0000 mg | ORAL_TABLET | Freq: Every day | ORAL | 2 refills | Status: AC

## 2023-01-09 MED ORDER — AMLODIPINE BESYLATE 10 MG PO TABS: 10.0000 mg | ORAL_TABLET | Freq: Every day | ORAL | 2 refills | Status: AC

## 2023-01-09 MED ORDER — METOPROLOL TARTRATE 25 MG PO TABS: 25.0000 mg | ORAL_TABLET | Freq: Two times a day (BID) | ORAL | 2 refills | Status: AC

## 2023-01-09 MED ORDER — FUROSEMIDE 20 MG PO TABS
20.0000 mg | ORAL_TABLET | Freq: Every day | ORAL | Status: DC
  Administered 2023-01-09: 20 mg via ORAL
  Filled 2023-01-09: qty 1

## 2023-01-09 MED ORDER — AMLODIPINE BESYLATE 10 MG PO TABS
10.0000 mg | ORAL_TABLET | Freq: Every day | ORAL | Status: DC
Start: 2023-01-10 — End: 2023-01-09

## 2023-01-09 MED ORDER — METOPROLOL TARTRATE 25 MG PO TABS
25.0000 mg | ORAL_TABLET | Freq: Two times a day (BID) | ORAL | Status: DC
  Administered 2023-01-09: 25 mg via ORAL
  Filled 2023-01-09: qty 1

## 2023-01-09 MED ORDER — FUROSEMIDE 20 MG PO TABS: 20.0000 mg | ORAL_TABLET | Freq: Every day | ORAL | 2 refills | Status: AC

## 2023-01-09 MED ORDER — POTASSIUM CHLORIDE CRYS ER 20 MEQ PO TBCR
40.0000 meq | EXTENDED_RELEASE_TABLET | Freq: Once | ORAL | Status: AC
  Administered 2023-01-09: 40 meq via ORAL
  Filled 2023-01-09: qty 2

## 2023-01-09 NOTE — Discharge Summary (Signed)
Physician Discharge Summary   Patient: Carla Silva MRN: [id number] DOB: Aug 03, 1965  Admit date:     01/06/2023  Discharge date: 01/09/23  Discharge Physician: Marcelino Duster   PCP: Nira Retort   Recommendations at discharge:    PCP follow up in 1 week. Cardiology follow up as scheduled.  Discharge Diagnoses: Principal Problem:   Chest pain Active Problems:   Marijuana use   Thoracic compression fracture (HCC)   Cirrhosis (HCC)  Resolved Problems:   * No resolved hospital problems. *  Hospital Course: Carla Silva is a 57 y.o. female with medical history significant of anxiety depression, marijuana use presenting with exertional chest pain.  Patient reports progressive exertional chest pain chest pressure over the past 2 weeks.  Symptoms persisted despite treatment of bronchitis. Patient can only take a handful of steps without significant shortness of breath and chest pressure.  Positive mild diaphoresis and nausea.  Previously heavy smoker.  Quit several years ago.  Mild orthopnea PND.  Was evaluated at cardiology clinic today and redirected to the ER.   Patient is admitted to the hospitalist service with impression of possible CHF, troponin remained flat.  Cardiologist evaluated the patient recommended echocardiogram, Lasix and Farxiga therapy.  Patient does have on and off headaches and feeling palpitations attributes his symptoms to elevated blood pressure.  Patient is started on Norvasc therapy which is increased to 10 mg dose, metoprolol 25 twice daily added by cardiology team.  Echocardiogram showed EF 55 to 60%, grade 1 diastolic dysfunction, right ventricular systolic function is severely reduced, severely elevated pulmonary artery systolic pressures.  IV Lasix transition to 20 mg daily.  Patient is hemodynamically stable to be discharged home with PCP and cardiology follow-up upon discharge.  Assessment and Plan: Acute diastolic heart  failure. Pulmonary hypertension. CTA negative for PE but is showing concern for right heart dysfunction Troponin 50s to 60s. BNP 1698. EKG normal sinus rhythm with nonspecific changes Echocardiogram showed EF 55 to 60%, grade 1 diastolic dysfunction, right ventricular systolic function is severely reduced, severely elevated pulmonary artery systolic pressures.   Cardiology follow-up appreciated IV Lasix transition to 20 mg daily.   Continue Farxiga therapy. CHF education provided. Salt, fluid restriction advised. Outpatient cardiology follow-up in 2 weeks.  Essential hypertension New diagnosis Started Norvasc 10mg  daily, Metoprolol 25mg  bid. Advised to keep a log of BP for PCP, cardiology to adjust antihypertensives.  Cirrhosis (HCC) Noted cirrhotic liver changes on CT  No reported ETOH use  LFTs grossly stable  Monitor   Thoracic compression fracture (HCC) Numerous compression fractures throughout the thoracic spine most severely affecting the T5 and T7 on imaging  Suspect partial contribution to overall pain on presentation  Discussed w/ Dr. Katrinka Blazing w/ NSG advised TLSO brace, no neurosurgical intervention. She is able to walk without support. Advised neurosurgery clinic follow-up with upright x-rays.  Marijuana use Advised to quit using marijuana.      Consultants: Cardiology Procedures performed: None Disposition: Home Diet recommendation:  Discharge Diet Orders (From admission, onward)     Start     Ordered   01/09/23 0000  Diet - low sodium heart healthy        01/09/23 1417           Cardiac diet DISCHARGE MEDICATION: Allergies as of 01/09/2023       Reactions   Macrobid [nitrofurantoin]         Medication List     STOP taking these medications  predniSONE 10 MG tablet Commonly known as: DELTASONE       TAKE these medications    amLODipine 10 MG tablet Commonly known as: NORVASC Take 1 tablet (10 mg total) by mouth daily. Start  taking on: January 10, 2023   Biotin 1 MG Caps Take 1 capsule by mouth daily.   citalopram 20 MG tablet Commonly known as: CELEXA Take 1 tablet by mouth daily.   Cyanocobalamin 1000 MCG Subl Take 1 tablet by mouth daily.   cyclobenzaprine 10 MG tablet Commonly known as: FLEXERIL Take 1 tablet (10 mg total) by mouth 2 (two) times daily as needed for muscle spasms.   dapagliflozin propanediol 10 MG Tabs tablet Commonly known as: FARXIGA Take 1 tablet (10 mg total) by mouth daily.   Ensure Take 237 mLs by mouth daily as needed.   furosemide 20 MG tablet Commonly known as: LASIX Take 1 tablet (20 mg total) by mouth daily.   Magnesium 400 MG Tabs Take 1 tablet by mouth daily.   metoprolol tartrate 25 MG tablet Commonly known as: LOPRESSOR Take 1 tablet (25 mg total) by mouth 2 (two) times daily.   Multi-Vitamin tablet Take 1 tablet by mouth daily.   omega-3 fish oil 1000 MG Caps capsule Commonly known as: MAXEPA Take 1 capsule by mouth daily.        Follow-up Information     Germaine Pomfret, Eliane Decree, NP. Go in 1 week(s).   Specialty: Nurse Practitioner Contact information: [address] [place] [address] [phone]                Discharge Exam: Ceasar Mons Weights   01/06/23 1554 01/08/23 0849 01/08/23 1705  Weight: 54.1 kg 50.9 kg 52.6 kg   General - Elderly thin built Caucasian female, no acute distress. HEENT - PERRLA, EOMI, atraumatic head, non tender sinuses. Lung - Clear, basal rales, no rhonchi, wheezes. Heart - S1, S2 heard, no murmurs, rubs, no pedal edema. Abdomen - Soft, non tender, nondistended, bowel sounds good Neuro - Alert, awake and oriented x 3, non focal exam. Skin - Warm and dry.  Condition at discharge: stable  The results of significant diagnostics from this hospitalization (including imaging, microbiology, ancillary and laboratory) are listed below for reference.   Imaging Studies: ECHOCARDIOGRAM  COMPLETE Result Date: 01/09/2023    ECHOCARDIOGRAM REPORT   Patient Name:   Carla Silva Date of Exam: 01/09/2023 Medical Rec #:  [id number]        Height:       63.0 in Accession #:    [id number]       Weight:       116.0 lb Date of Birth:  May 21, 1965        BSA:          1.534 m Patient Age:    57 years         BP:           127/99 mmHg Patient Gender: F                HR:           86 bpm. Exam Location:  [facility] Procedure: 2D Echo, Cardiac Doppler and Color Doppler STAT ECHO Indications:     Chest pain R07.9  History:         Patient has no prior history of Echocardiogram examinations.                  Anemia, anxiety.  Sonographer:     Cristela Blue Referring Phys:  [id number] Marcelino Duster Diagnosing Phys: Adrian Blackwater IMPRESSIONS  1. Left ventricular ejection fraction, by estimation, is 55 to 60%. The left ventricle has normal function. The left ventricle has no regional wall motion abnormalities. There is moderate concentric left ventricular hypertrophy. Left ventricular diastolic parameters are consistent with Grade I diastolic dysfunction (impaired relaxation).  2. Right ventricular systolic function is severely reduced. The right ventricular size is severely enlarged. There is severely elevated pulmonary artery systolic pressure.  3. Left atrial size was moderately dilated.  4. Right atrial size was severely dilated.  5. The mitral valve is normal in structure. Trivial mitral valve regurgitation. No evidence of mitral stenosis.  6. Tricuspid valve regurgitation is moderate to severe.  7. The aortic valve is normal in structure. Aortic valve regurgitation is not visualized. No aortic stenosis is present.  8. The inferior vena cava is normal in size with greater than 50% respiratory variability, suggesting right atrial pressure of 3 mmHg. FINDINGS  Left Ventricle: Left ventricular ejection fraction, by estimation, is 55 to 60%. The left ventricle has normal function. The left ventricle has no regional  wall motion abnormalities. The left ventricular internal cavity size was normal in size. There is  moderate concentric left ventricular hypertrophy. Left ventricular diastolic parameters are consistent with Grade I diastolic dysfunction (impaired relaxation). Right Ventricle: The right ventricular size is severely enlarged. No increase in right ventricular wall thickness. Right ventricular systolic function is severely reduced. There is severely elevated pulmonary artery systolic pressure. Left Atrium: Left atrial size was moderately dilated. Right Atrium: Right atrial size was severely dilated. Pericardium: There is no evidence of pericardial effusion. Mitral Valve: The mitral valve is normal in structure. Trivial mitral valve regurgitation. No evidence of mitral valve stenosis. Tricuspid Valve: The tricuspid valve is normal in structure. Tricuspid valve regurgitation is moderate to severe. No evidence of tricuspid stenosis. Aortic Valve: The aortic valve is normal in structure. Aortic valve regurgitation is not visualized. No aortic stenosis is present. Aortic valve mean gradient measures 2.0 mmHg. Aortic valve peak gradient measures 2.4 mmHg. Aortic valve area, by VTI measures 3.01 cm. Pulmonic Valve: The pulmonic valve was normal in structure. Pulmonic valve regurgitation is trivial. No evidence of pulmonic stenosis. Aorta: The aortic root is normal in size and structure. Venous: The inferior vena cava is normal in size with greater than 50% respiratory variability, suggesting right atrial pressure of 3 mmHg. IAS/Shunts: No atrial level shunt detected by color flow Doppler.  LEFT VENTRICLE PLAX 2D LVIDd:         3.10 cm   Diastology LVIDs:         2.20 cm   LV e' medial:    6.96 cm/s LV PW:         0.90 cm   LV E/e' medial:  6.6 LV IVS:        1.20 cm   LV e' lateral:   7.83 cm/s LVOT diam:     2.00 cm   LV E/e' lateral: 5.9 LV SV:         31 LV SV Index:   20 LVOT Area:     3.14 cm  RIGHT VENTRICLE RV Basal  diam:  4.60 cm RV Mid diam:    3.90 cm RV S prime:     6.42 cm/s TAPSE (M-mode): 1.3 cm LEFT ATRIUM         Index  RIGHT ATRIUM           Index LA diam:    1.50 cm 0.98 cm/m  RA Area:     11.80 cm                                 RA Volume:   27.20 ml  17.73 ml/m  AORTIC VALVE AV Area (Vmax):    2.96 cm AV Area (Vmean):   2.51 cm AV Area (VTI):     3.01 cm AV Vmax:           78.00 cm/s AV Vmean:          58.650 cm/s AV VTI:            0.104 m AV Peak Grad:      2.4 mmHg AV Mean Grad:      2.0 mmHg LVOT Vmax:         73.40 cm/s LVOT Vmean:        46.900 cm/s LVOT VTI:          0.100 m LVOT/AV VTI ratio: 0.96  AORTA Ao Root diam: 2.80 cm MITRAL VALVE               TRICUSPID VALVE MV Area (PHT): 4.89 cm    TR Peak grad:   65.0 mmHg MV Decel Time: 155 msec    TR Vmax:        403.00 cm/s MV E velocity: 46.10 cm/s MV A velocity: 88.60 cm/s  SHUNTS MV E/A ratio:  0.52        Systemic VTI:  0.10 m                            Systemic Diam: 2.00 cm Adrian Blackwater Electronically signed by Adrian Blackwater Signature Date/Time: 01/09/2023/9:29:31 AM    Final    CT Angio Chest PE W and/or Wo Contrast Result Date: 01/06/2023 CLINICAL DATA:  Pulmonary embolism (PE) suspected, high prob EXAM: CT ANGIOGRAPHY CHEST WITH CONTRAST TECHNIQUE: Multidetector CT imaging of the chest was performed using the standard protocol during bolus administration of intravenous contrast. Multiplanar CT image reconstructions and MIPs were obtained to evaluate the vascular anatomy. RADIATION DOSE REDUCTION: This exam was performed according to the departmental dose-optimization program which includes automated exposure control, adjustment of the mA and/or kV according to patient size and/or use of iterative reconstruction technique. CONTRAST:  75mL OMNIPAQUE IOHEXOL 350 MG/ML SOLN COMPARISON:  X-ray 01/06/2023 FINDINGS: Cardiovascular: Heart size within normal limits. No significant pericardial effusion although there is some fluid distension of  the pericardial recesses. Thoracic aorta is nonaneurysmal. Scattered atherosclerotic vascular calcifications of the aorta and coronary arteries. Pulmonary vasculature is well opacified. There is some streaky artifact in the left main pulmonary artery which also involves the lobar branches of the left upper and left lower lobes. This is felt to be secondary to turbulent flow. No evidence of pulmonary embolism. Pulmonary arteries are mildly dilated. There is distension of the right ventricle with reflux of contrast into the IVC and hepatic veins compatible with right heart dysfunction. Mediastinum/Nodes: No enlarged mediastinal, hilar, or axillary lymph nodes. Thyroid gland, trachea, and esophagus demonstrate no significant findings. Lungs/Pleura: Mild emphysema. Mild bibasilar subsegmental atelectasis, right greater than left. Trace amount of pleural fluid present bilaterally. No pneumothorax. Upper Abdomen: Nodular hepatic surface contour suggestive of cirrhosis. Musculoskeletal: Thoracic levocurvature. Numerous  compression fractures throughout the thoracic spine, most severely affecting the T5 and T7 vertebral bodies. Fractures appear to be chronic although the T5 level could potentially be subacute. Healing left ninth rib fracture. Multiple healed right-sided rib fractures. No chest wall abnormality. Review of the MIP images confirms the above findings. IMPRESSION: 1. No evidence of pulmonary embolism. 2. Mild bibasilar subsegmental atelectasis, right greater than left. Trace amount of pleural fluid present bilaterally. 3. Findings compatible with right heart dysfunction. 4. Numerous compression fractures throughout the thoracic spine, most severely affecting the T5 and T7 vertebral bodies. Fractures appear to be chronic although the T5 level could potentially be subacute. 5. Nodular hepatic surface contour suggestive of cirrhosis. 6. Aortic and coronary artery atherosclerosis (ICD10-I70.0). Electronically Signed    By: Duanne Guess D.O.   On: 01/06/2023 14:09   DG Chest 2 View Result Date: 01/06/2023 CLINICAL DATA:  Two-week history of shortness of breath and weakness EXAM: CHEST - 2 VIEW COMPARISON:  Chest radiograph dated 12/21/2018 FINDINGS: Normal lung volumes. Right basilar patchy and linear opacities. Blunting of the bilateral costophrenic angles. No pneumothorax. The heart size and mediastinal contours are within normal limits. No acute osseous abnormality. IMPRESSION: 1. Right basilar patchy and linear opacities, which may represent atelectasis, aspiration, or pneumonia. 2. Blunting of the bilateral costophrenic angles, which may represent trace pleural effusions or pleural thickening. Electronically Signed   By: Agustin Cree M.D.   On: 01/06/2023 10:52   DG Chest 2 View Result Date: 12/21/2022 CLINICAL DATA:  Shortness of breath and fatigue. EXAM: CHEST - 2 VIEW COMPARISON:  January 26, 2021 FINDINGS: The heart size and mediastinal contours are within normal limits. There is no evidence of an acute infiltrate, pleural effusion or pneumothorax. Chronic right-sided rib deformities are noted. There is mild levoscoliosis of the midthoracic spine and moderate to marked severity dextroscoliosis of the lower thoracic and upper lumbar spine. IMPRESSION: No active cardiopulmonary disease. Electronically Signed   By: Aram Candela M.D.   On: 12/21/2022 18:28    Microbiology: No results found for this or any previous visit.  Labs: CBC: Recent Labs  Lab 01/06/23 0949 01/09/23 0301  WBC 5.5 4.7  NEUTROABS 2.9  --   HGB 15.5* 14.1  HCT 46.4* 41.0  MCV 90.1 86.3  PLT 147* 114*   Basic Metabolic Panel: Recent Labs  Lab 01/06/23 0949 01/08/23 0954 01/09/23 0301  NA 134* 134* 130*  K 3.7 3.7 3.3*  CL 105 102 100  CO2 22 22 19*  GLUCOSE 90 126* 108*  BUN 15 12 16   CREATININE 1.01* 0.97 0.87  CALCIUM 9.2 9.4 8.7*   Liver Function Tests: Recent Labs  Lab 01/06/23 0949  AST 58*  ALT 39   ALKPHOS 74  BILITOT 1.2*  PROT 8.0  ALBUMIN 3.5   CBG: No results for input(s): "GLUCAP" in the last 168 hours.  Discharge time spent: 37 minutes.  Signed: Marcelino Duster, MD [facility] 01/09/2023

## 2023-01-09 NOTE — Progress Notes (Addendum)
[facility] CLINIC CARDIOLOGY PROGRESS NOTE   Patient ID: Carla Silva MRN: [id number] DOB/AGE: 1965/03/01 57 y.o.  Admit date: 01/06/2023 Referring Physician Dr. Doree Albee [facility]  Primary Cardiologist Marijo Conception, NP Reason for Consultation chest pain  HPI: Carla Silva is a 57 y.o. female with a past medical history of anxiety, chronic pain syndrome, h/o narcotic use (pt reported remission since 10 years), hepatic cirrhosis, GIB 12/2019 secondary to NSAIDs with IDA sequela, chronic marijuana use who presented to the ED on 01/06/2023 for chest pain. Cardiology was consulted for further evaluation.   Interval History:  -Patient seen and examind this AM, reports feeling ok. States she has a headache. -States CP has resolved, SOB much improved. Denies any LE edema.  -Reports occasional episodes of palpitations, no evidence of arrhythmia on tele.  -Renal function remains stable with diuresis.   Review of systems complete and found to be negative unless listed above    Vitals:   01/08/23 2007 01/08/23 2329 01/09/23 0528 01/09/23 1152  BP: (!) 121/99 (!) 124/103 (!) 127/99 102/87  Pulse: (!) 102 90 86 90  Resp: 18 20 18 18   Temp: 97.7 F (36.5 C) 98.1 F (36.7 C) 98 F (36.7 C) 98 F (36.7 C)  TempSrc: Oral     SpO2: 96% 97% 96% 94%  Weight:      Height:         Intake/Output Summary (Last 24 hours) at 01/09/2023 1240 Last data filed at 01/08/2023 2010 Gross per 24 hour  Intake --  Output 650 ml  Net -650 ml     PHYSICAL EXAM General: Well appearing female, well nourished, in no acute distress. HEENT: Normocephalic and atraumatic. Neck: No JVD.  Lungs: Normal respiratory effort on room air. Clear bilaterally to auscultation. No wheezes, crackles, rhonchi.  Heart: HRRR. Normal S1 and S2 without gallops or murmurs. Radial & DP pulses 2+ bilaterally. Abdomen: Non-distended appearing.  Msk: Normal strength and tone for  age. Extremities: No clubbing, cyanosis or edema.   Neuro: Alert and oriented X 3. Psych: Mood appropriate, affect congruent.    LABS: Basic Metabolic Panel: Recent Labs    01/08/23 0954 01/09/23 0301  NA 134* 130*  K 3.7 3.3*  CL 102 100  CO2 22 19*  GLUCOSE 126* 108*  BUN 12 16  CREATININE 0.97 0.87  CALCIUM 9.4 8.7*   Liver Function Tests: No results for input(s): "AST", "ALT", "ALKPHOS", "BILITOT", "PROT", "ALBUMIN" in the last 72 hours. No results for input(s): "LIPASE", "AMYLASE" in the last 72 hours. CBC: Recent Labs    01/09/23 0301  WBC 4.7  HGB 14.1  HCT 41.0  MCV 86.3  PLT 114*   Cardiac Enzymes: Recent Labs    01/06/23 1807 01/06/23 2117  TROPONINIHS 60* 58*   BNP: Recent Labs    01/06/23 1807  BNP 1,698.2*   D-Dimer: No results for input(s): "DDIMER" in the last 72 hours. Hemoglobin A1C: No results for input(s): "HGBA1C" in the last 72 hours. Fasting Lipid Panel: Recent Labs    01/07/23 0209  CHOL 152  HDL 39*  LDLCALC 97  TRIG 82  CHOLHDL 3.9   Thyroid Function Tests: Recent Labs    01/06/23 1807  TSH 5.070*   Anemia Panel: No results for input(s): "VITAMINB12", "FOLATE", "FERRITIN", "TIBC", "IRON", "RETICCTPCT" in the last 72 hours.  ECHOCARDIOGRAM COMPLETE Result Date: 01/09/2023    ECHOCARDIOGRAM REPORT   Patient Name:   Carla Silva Date of Exam:  01/09/2023 Medical Rec #:  [id number]        Height:       63.0 in Accession #:    [id number]       Weight:       116.0 lb Date of Birth:  [birth year]        BSA:          1.534 m Patient Age:    57 years         BP:           127/99 mmHg Patient Gender: F                HR:           86 bpm. Exam Location:  [facility] Procedure: 2D Echo, Cardiac Doppler and Color Doppler STAT ECHO Indications:     Chest pain R07.9  History:         Patient has no prior history of Echocardiogram examinations.                  Anemia, anxiety.  Sonographer:     Cristela Blue Referring Phys:  [id number]  Marcelino Duster Diagnosing Phys: Adrian Blackwater IMPRESSIONS  1. Left ventricular ejection fraction, by estimation, is 55 to 60%. The left ventricle has normal function. The left ventricle has no regional wall motion abnormalities. There is moderate concentric left ventricular hypertrophy. Left ventricular diastolic parameters are consistent with Grade I diastolic dysfunction (impaired relaxation).  2. Right ventricular systolic function is severely reduced. The right ventricular size is severely enlarged. There is severely elevated pulmonary artery systolic pressure.  3. Left atrial size was moderately dilated.  4. Right atrial size was severely dilated.  5. The mitral valve is normal in structure. Trivial mitral valve regurgitation. No evidence of mitral stenosis.  6. Tricuspid valve regurgitation is moderate to severe.  7. The aortic valve is normal in structure. Aortic valve regurgitation is not visualized. No aortic stenosis is present.  8. The inferior vena cava is normal in size with greater than 50% respiratory variability, suggesting right atrial pressure of 3 mmHg. FINDINGS  Left Ventricle: Left ventricular ejection fraction, by estimation, is 55 to 60%. The left ventricle has normal function. The left ventricle has no regional wall motion abnormalities. The left ventricular internal cavity size was normal in size. There is  moderate concentric left ventricular hypertrophy. Left ventricular diastolic parameters are consistent with Grade I diastolic dysfunction (impaired relaxation). Right Ventricle: The right ventricular size is severely enlarged. No increase in right ventricular wall thickness. Right ventricular systolic function is severely reduced. There is severely elevated pulmonary artery systolic pressure. Left Atrium: Left atrial size was moderately dilated. Right Atrium: Right atrial size was severely dilated. Pericardium: There is no evidence of pericardial effusion. Mitral Valve: The mitral  valve is normal in structure. Trivial mitral valve regurgitation. No evidence of mitral valve stenosis. Tricuspid Valve: The tricuspid valve is normal in structure. Tricuspid valve regurgitation is moderate to severe. No evidence of tricuspid stenosis. Aortic Valve: The aortic valve is normal in structure. Aortic valve regurgitation is not visualized. No aortic stenosis is present. Aortic valve mean gradient measures 2.0 mmHg. Aortic valve peak gradient measures 2.4 mmHg. Aortic valve area, by VTI measures 3.01 cm. Pulmonic Valve: The pulmonic valve was normal in structure. Pulmonic valve regurgitation is trivial. No evidence of pulmonic stenosis. Aorta: The aortic root is normal in size and structure. Venous: The inferior vena cava is normal in size with greater  than 50% respiratory variability, suggesting right atrial pressure of 3 mmHg. IAS/Shunts: No atrial level shunt detected by color flow Doppler.  LEFT VENTRICLE PLAX 2D LVIDd:         3.10 cm   Diastology LVIDs:         2.20 cm   LV e' medial:    6.96 cm/s LV PW:         0.90 cm   LV E/e' medial:  6.6 LV IVS:        1.20 cm   LV e' lateral:   7.83 cm/s LVOT diam:     2.00 cm   LV E/e' lateral: 5.9 LV SV:         31 LV SV Index:   20 LVOT Area:     3.14 cm  RIGHT VENTRICLE RV Basal diam:  4.60 cm RV Mid diam:    3.90 cm RV S prime:     6.42 cm/s TAPSE (M-mode): 1.3 cm LEFT ATRIUM         Index       RIGHT ATRIUM           Index LA diam:    1.50 cm 0.98 cm/m  RA Area:     11.80 cm                                 RA Volume:   27.20 ml  17.73 ml/m  AORTIC VALVE AV Area (Vmax):    2.96 cm AV Area (Vmean):   2.51 cm AV Area (VTI):     3.01 cm AV Vmax:           78.00 cm/s AV Vmean:          58.650 cm/s AV VTI:            0.104 m AV Peak Grad:      2.4 mmHg AV Mean Grad:      2.0 mmHg LVOT Vmax:         73.40 cm/s LVOT Vmean:        46.900 cm/s LVOT VTI:          0.100 m LVOT/AV VTI ratio: 0.96  AORTA Ao Root diam: 2.80 cm MITRAL VALVE                TRICUSPID VALVE MV Area (PHT): 4.89 cm    TR Peak grad:   65.0 mmHg MV Decel Time: 155 msec    TR Vmax:        403.00 cm/s MV E velocity: 46.10 cm/s MV A velocity: 88.60 cm/s  SHUNTS MV E/A ratio:  0.52        Systemic VTI:  0.10 m                            Systemic Diam: 2.00 cm Adrian Blackwater Electronically signed by Adrian Blackwater Signature Date/Time: 01/09/2023/9:29:31 AM    Final      ECHO as above  TELEMETRY reviewed by me 01/09/23: sinus rhythm rate 90s  EKG reviewed by me 01/09/23: sinus rhythm rate 71 bpm, nonischemic  DATA reviewed by me 01/09/23: last 24h vitals tele labs imaging I/O, hospitalist progress note  Principal Problem:   Chest pain Active Problems:   Marijuana use   Thoracic compression fracture (HCC)   Cirrhosis (HCC)    ASSESSMENT AND PLAN: Carla Silva  is a 57 y.o. female with a past medical history of anxiety, chronic pain syndrome, h/o narcotic use (pt reported remission since 10 years), hepatic cirrhosis, GIB 12/2019 secondary to NSAIDs with IDA sequela, chronic marijuana use who presented to the ED on 01/06/2023 for chest pain. Cardiology was consulted for further evaluation.   # Acute on chronic HFpEF # Chest pain # Hypertension Patient with DOE and chest discomfort worsening for the last few months. BNP 1600 on admission. Troponins 62 > 52 > 60 > 58. EKG nonacute. CP improved with diuresis. Echo this admission with EF 55-60%, moderate LVH, severely reduced RV function and elevated PASP. Patient reports a history of pulmonary hypertension.  -Will switch to po lasix 20 mg daily.  -Start metoprolol 25 mg twice daily. Increase amlodipine to 10 mg daily.   Can consider DC home today. Recommend follow up in clinic in 1-2 weeks.   This patient's case was discussed and created with Dr. Juliann Pares and he is in agreement.  Signed:  Gale Journey, PA-C  01/09/2023, 12:40 PM [facility] Cardiology

## 2023-01-09 NOTE — Progress Notes (Signed)
Transition of Care [facility]) - Inpatient Brief Assessment   Patient Details  Name: Carla Silva MRN: [id number] Date of Birth: 11-22-1965  [facility] CM/SW Contact:    Truddie Hidden, RN Phone Number: 01/09/2023, 2:57 PM   Clinical Narrative: TOC continuing to follow patient's progress throughout discharge planning.   Transition of Care Asessment: Insurance and Status: Insurance coverage has been reviewed Patient has primary care physician: Yes Home environment has been reviewed: Home Prior level of function:: Independent Prior/Current Home Services: No current home services Social Drivers of Health Review: SDOH reviewed no interventions necessary Readmission risk has been reviewed: Yes Transition of care needs: no transition of care needs at this time

## 2023-01-10 LAB — T3, FREE: T3, Free: 1.9 pg/mL — ABNORMAL LOW (ref 2.0–4.4)
